# Patient Record
Sex: Female | Born: 1960 | Race: White | Hispanic: No | Marital: Married | Smoking: Former smoker
Health system: Southern US, Community
[De-identification: ages and names within clinical notes are randomized; demographics above are authoritative.]

## PROBLEM LIST (undated history)

## (undated) DIAGNOSIS — M5440 Lumbago with sciatica, unspecified side: Secondary | ICD-10-CM

## (undated) DIAGNOSIS — Z79899 Other long term (current) drug therapy: Secondary | ICD-10-CM

## (undated) DIAGNOSIS — M47817 Spondylosis without myelopathy or radiculopathy, lumbosacral region: Secondary | ICD-10-CM

## (undated) DIAGNOSIS — T4145XA Adverse effect of unspecified anesthetic, initial encounter: Secondary | ICD-10-CM

## (undated) DIAGNOSIS — N6019 Diffuse cystic mastopathy of unspecified breast: Secondary | ICD-10-CM

## (undated) DIAGNOSIS — K219 Gastro-esophageal reflux disease without esophagitis: Secondary | ICD-10-CM

## (undated) DIAGNOSIS — R300 Dysuria: Secondary | ICD-10-CM

## (undated) DIAGNOSIS — F419 Anxiety disorder, unspecified: Secondary | ICD-10-CM

## (undated) DIAGNOSIS — K900 Celiac disease: Secondary | ICD-10-CM

## (undated) DIAGNOSIS — Z9223 Personal history of estrogen therapy: Secondary | ICD-10-CM

## (undated) DIAGNOSIS — I10 Essential (primary) hypertension: Secondary | ICD-10-CM

## (undated) DIAGNOSIS — T8859XA Other complications of anesthesia, initial encounter: Secondary | ICD-10-CM

## (undated) HISTORY — DX: Celiac disease: K90.0

## (undated) HISTORY — DX: Diffuse cystic mastopathy of unspecified breast: N60.19

## (undated) HISTORY — DX: Anxiety disorder, unspecified: F41.9

## (undated) HISTORY — PX: BACK SURGERY: SHX140

## (undated) HISTORY — PX: CARPAL TUNNEL RELEASE: SHX101

## (undated) HISTORY — DX: Personal history of estrogen therapy: Z92.23

## (undated) HISTORY — DX: Dysuria: R30.0

## (undated) HISTORY — DX: Lumbago with sciatica, unspecified side: M54.40

## (undated) HISTORY — DX: Other long term (current) drug therapy: Z79.899

## (undated) HISTORY — DX: Spondylosis without myelopathy or radiculopathy, lumbosacral region: M47.817

## (undated) HISTORY — DX: Essential (primary) hypertension: I10

---

## 2001-01-27 ENCOUNTER — Other Ambulatory Visit: Admission: RE | Admit: 2001-01-27 | Discharge: 2001-01-27 | Payer: Self-pay | Admitting: Obstetrics and Gynecology

## 2001-05-14 ENCOUNTER — Ambulatory Visit (HOSPITAL_COMMUNITY): Admission: RE | Admit: 2001-05-14 | Discharge: 2001-05-14 | Payer: Self-pay | Admitting: Specialist

## 2001-05-14 ENCOUNTER — Encounter: Payer: Self-pay | Admitting: Specialist

## 2001-12-08 ENCOUNTER — Encounter: Payer: Self-pay | Admitting: Specialist

## 2001-12-08 ENCOUNTER — Ambulatory Visit (HOSPITAL_COMMUNITY): Admission: RE | Admit: 2001-12-08 | Discharge: 2001-12-08 | Payer: Self-pay | Admitting: Specialist

## 2002-12-26 ENCOUNTER — Emergency Department (HOSPITAL_COMMUNITY): Admission: EM | Admit: 2002-12-26 | Discharge: 2002-12-26 | Payer: Self-pay | Admitting: Emergency Medicine

## 2002-12-26 ENCOUNTER — Encounter: Payer: Self-pay | Admitting: Internal Medicine

## 2003-02-17 ENCOUNTER — Ambulatory Visit (HOSPITAL_COMMUNITY): Admission: RE | Admit: 2003-02-17 | Discharge: 2003-02-17 | Payer: Self-pay | Admitting: Obstetrics and Gynecology

## 2003-02-17 ENCOUNTER — Encounter: Payer: Self-pay | Admitting: Obstetrics and Gynecology

## 2004-04-04 ENCOUNTER — Ambulatory Visit (HOSPITAL_COMMUNITY): Admission: RE | Admit: 2004-04-04 | Discharge: 2004-04-04 | Payer: Self-pay | Admitting: Family Medicine

## 2004-04-19 ENCOUNTER — Ambulatory Visit (HOSPITAL_COMMUNITY): Admission: RE | Admit: 2004-04-19 | Discharge: 2004-04-19 | Payer: Self-pay | Admitting: Specialist

## 2004-04-24 ENCOUNTER — Encounter: Admission: RE | Admit: 2004-04-24 | Discharge: 2004-04-24 | Payer: Self-pay | Admitting: Specialist

## 2005-05-07 ENCOUNTER — Ambulatory Visit (HOSPITAL_COMMUNITY): Admission: RE | Admit: 2005-05-07 | Discharge: 2005-05-07 | Payer: Self-pay | Admitting: Specialist

## 2005-06-05 ENCOUNTER — Ambulatory Visit (HOSPITAL_COMMUNITY): Admission: RE | Admit: 2005-06-05 | Discharge: 2005-06-05 | Payer: Self-pay | Admitting: Specialist

## 2005-07-01 HISTORY — PX: TOTAL ABDOMINAL HYSTERECTOMY: SHX209

## 2005-07-02 ENCOUNTER — Encounter: Payer: Self-pay | Admitting: Obstetrics and Gynecology

## 2005-07-02 ENCOUNTER — Inpatient Hospital Stay (HOSPITAL_COMMUNITY): Admission: RE | Admit: 2005-07-02 | Discharge: 2005-07-04 | Payer: Self-pay | Admitting: Obstetrics and Gynecology

## 2005-10-01 HISTORY — PX: APPENDECTOMY: SHX54

## 2005-10-01 HISTORY — PX: BILATERAL SALPINGOOPHORECTOMY: SHX1223

## 2006-01-17 ENCOUNTER — Inpatient Hospital Stay (HOSPITAL_COMMUNITY): Admission: RE | Admit: 2006-01-17 | Discharge: 2006-01-19 | Payer: Self-pay | Admitting: Obstetrics and Gynecology

## 2006-01-17 ENCOUNTER — Encounter (INDEPENDENT_AMBULATORY_CARE_PROVIDER_SITE_OTHER): Payer: Self-pay | Admitting: *Deleted

## 2006-09-16 ENCOUNTER — Ambulatory Visit: Payer: Self-pay | Admitting: Orthopedic Surgery

## 2006-10-04 ENCOUNTER — Ambulatory Visit (HOSPITAL_COMMUNITY): Admission: RE | Admit: 2006-10-04 | Discharge: 2006-10-04 | Payer: Self-pay | Admitting: Orthopedic Surgery

## 2006-10-04 ENCOUNTER — Ambulatory Visit: Payer: Self-pay | Admitting: Orthopedic Surgery

## 2006-10-07 ENCOUNTER — Ambulatory Visit: Payer: Self-pay | Admitting: Orthopedic Surgery

## 2006-10-15 ENCOUNTER — Ambulatory Visit: Payer: Self-pay | Admitting: Orthopedic Surgery

## 2006-10-30 ENCOUNTER — Ambulatory Visit: Payer: Self-pay | Admitting: Orthopedic Surgery

## 2007-01-01 ENCOUNTER — Ambulatory Visit: Payer: Self-pay | Admitting: Orthopedic Surgery

## 2007-01-29 ENCOUNTER — Ambulatory Visit: Payer: Self-pay | Admitting: Orthopedic Surgery

## 2007-03-13 ENCOUNTER — Ambulatory Visit: Payer: Self-pay | Admitting: Orthopedic Surgery

## 2007-04-03 ENCOUNTER — Ambulatory Visit: Payer: Self-pay | Admitting: Orthopedic Surgery

## 2007-04-28 ENCOUNTER — Ambulatory Visit: Payer: Self-pay | Admitting: Orthopedic Surgery

## 2007-05-26 ENCOUNTER — Ambulatory Visit: Payer: Self-pay | Admitting: Orthopedic Surgery

## 2007-05-28 ENCOUNTER — Encounter: Payer: Self-pay | Admitting: Orthopedic Surgery

## 2007-07-25 DIAGNOSIS — Z8679 Personal history of other diseases of the circulatory system: Secondary | ICD-10-CM | POA: Insufficient documentation

## 2007-07-28 ENCOUNTER — Ambulatory Visit: Payer: Self-pay | Admitting: Orthopedic Surgery

## 2007-07-28 DIAGNOSIS — M25539 Pain in unspecified wrist: Secondary | ICD-10-CM | POA: Insufficient documentation

## 2007-08-15 ENCOUNTER — Ambulatory Visit: Payer: Self-pay | Admitting: Orthopedic Surgery

## 2007-08-15 ENCOUNTER — Ambulatory Visit (HOSPITAL_COMMUNITY): Admission: RE | Admit: 2007-08-15 | Discharge: 2007-08-15 | Payer: Self-pay | Admitting: Orthopedic Surgery

## 2007-08-15 ENCOUNTER — Encounter: Payer: Self-pay | Admitting: Orthopedic Surgery

## 2007-08-25 ENCOUNTER — Ambulatory Visit: Payer: Self-pay | Admitting: Orthopedic Surgery

## 2007-09-17 ENCOUNTER — Ambulatory Visit: Payer: Self-pay | Admitting: Orthopedic Surgery

## 2008-08-23 ENCOUNTER — Ambulatory Visit (HOSPITAL_COMMUNITY): Admission: RE | Admit: 2008-08-23 | Discharge: 2008-08-23 | Payer: Self-pay | Admitting: Obstetrics and Gynecology

## 2009-11-04 ENCOUNTER — Encounter: Payer: Self-pay | Admitting: Orthopedic Surgery

## 2010-10-31 NOTE — Letter (Signed)
Summary: Medical record request + authorization  Medical record request + authorization   Imported By: Ihor Austin 01/30/2010 18:13:05  _____________________________________________________________________  External Attachment:    Type:   Image     Comment:   External Document

## 2010-12-31 HISTORY — PX: NECK SURGERY: SHX720

## 2011-01-03 ENCOUNTER — Other Ambulatory Visit (HOSPITAL_COMMUNITY): Payer: Self-pay | Admitting: Internal Medicine

## 2011-01-03 DIAGNOSIS — M5412 Radiculopathy, cervical region: Secondary | ICD-10-CM

## 2011-01-03 DIAGNOSIS — R52 Pain, unspecified: Secondary | ICD-10-CM

## 2011-01-05 ENCOUNTER — Ambulatory Visit (HOSPITAL_COMMUNITY)
Admission: RE | Admit: 2011-01-05 | Discharge: 2011-01-05 | Disposition: A | Discharge status: Home / Self Care | Payer: BC Managed Care – PPO | Source: Ambulatory Visit | Attending: Internal Medicine | Admitting: Internal Medicine

## 2011-01-05 DIAGNOSIS — M4802 Spinal stenosis, cervical region: Secondary | ICD-10-CM | POA: Insufficient documentation

## 2011-01-05 DIAGNOSIS — M542 Cervicalgia: Secondary | ICD-10-CM | POA: Insufficient documentation

## 2011-01-05 DIAGNOSIS — M5020 Other cervical disc displacement, unspecified cervical region: Secondary | ICD-10-CM | POA: Insufficient documentation

## 2011-01-05 DIAGNOSIS — M25519 Pain in unspecified shoulder: Secondary | ICD-10-CM | POA: Insufficient documentation

## 2011-01-05 DIAGNOSIS — R52 Pain, unspecified: Secondary | ICD-10-CM

## 2011-01-05 DIAGNOSIS — M5412 Radiculopathy, cervical region: Secondary | ICD-10-CM

## 2011-02-13 NOTE — H&P (Signed)
[name], Walsh              ACCOUNT NO.:  [id number]   MEDICAL RECORD NO.:  [id number]          PATIENT TYPE:  AMB   LOCATION:  DAY                           FACILITY:  [facility]   PHYSICIAN:  Carole Civil, M.D.DATE OF BIRTH:  Feb 24, 1961   DATE OF ADMISSION:  DATE OF DISCHARGE:  LH                              HISTORY & PHYSICAL   CHIEF COMPLAINT:  Pain over the incision status post right carpal tunnel  release.   HISTORY:  A 50 year old female initially referred to me by Dr. Orson Ape  of [facility]. She complained of 5 months of carpal  tunnel like symptoms, had carpal tunnel release in January 2008, did  well except for incisional pain which has persisted despite injections,  rest, anti-inflammatories and steroids. She has also taken some pain  medication. She also tried desensitization techniques. She continues to  have pain and we have decided to explore the sensory branch of the  median nerve to see if this is the cause of her problem.   REVIEW OF SYSTEMS:  Negative.   ALLERGIES:  None.   History of hypertension, status post hysterectomy, takes estrogen and  lisinopril.   FAMILY HISTORY:  Negative.  She is a married housewife, does not smoke,  drink or use caffeinated beverages.  Highest grade completed was 12.   PHYSICAL EXAMINATION:  Weight 125, pulse 70, respiratory rate 18.  HEENT:  Normal.  NECK:  Supple.  There is no lymphadenopathy.  RESPIRATORY:  Clear lungs.  CARDIOVASCULAR:  Normal pulse and perfusion.  ABDOMEN:  Soft, nontender.  No distention.  RIGHT UPPER EXTREMITY:  Shows a well-healed scar which is tender on the  thumb side or thenar side.  Percussion reveals some discomfort. Carpal  tunnel tests were negative. Full range of motion noted wrist and hand.   IMPRESSION:  Persistent pain right wrist status post carpal tunnel  release. Plan for exploration of the an incision and exploration of the  sensory branch of the median  nerve.      Carole Civil, M.D.  Electronically Signed     SEH/MEDQ  D:  08/14/2007  T:  08/15/2007  Job:  [id number]   cc:   [facility]

## 2011-02-13 NOTE — Op Note (Signed)
NAMESYDNE, KRAHL              ACCOUNT NO.:  [id number]   MEDICAL RECORD NO.:  [id number]          PATIENT TYPE:  AMB   LOCATION:  DAY                           FACILITY:  [facility]   PHYSICIAN:  Carole Civil, M.D.DATE OF BIRTH:  1961/01/25   DATE OF PROCEDURE:  08/15/2007  DATE OF DISCHARGE:                               OPERATIVE REPORT   HISTORY:  This is a 50 year old female initially referred to me by Dr.  Orson Ape of [facility].  She had a carpal tunnel release  January of 2008.  All carpal tunnel symptoms were relieved, but she  still continued to have incisional pain and pain just radial to the  incision over the FCR and trapezium.  We treated her with  desensitization, anti-inflammatories, rest, bracing, steroids,  injections.  She did not improve.  She presented for exploration of the  right wrist with the presumptive diagnosis of median nerve sensory  branch entrapment.   PREOPERATIVE DIAGNOSIS:  Pain, right wrist.   POSTOPERATIVE DIAGNOSIS:  Spur, right wrist.   PROCEDURE:  Exploration of right wrist with removal of spur from  trapezium.   SURGEON:  Carole Civil, M.D., no assistants.   ANESTHETIC:  Regional Bier block.   No specimens.  No blood loss.  A 39-minute tourniquet time.  Counts were  correct at end of procedure and no complications.  The patient went to  PACU in good condition.   The patient was properly identified in the preoperative holding area as  [name].  Right wrist was properly marked for surgery.  History  and physical update was completed.  The patient was taken to surgery,  given Ancef and started with a Bier block.  After successful Bier block,  the right arm was prepped and draped using sterile technique.   The previous incision was marked, and then this incision was carried  further proximally with the marker.   We completed the time-out procedure, made the incision, carried it up  proximally across  the FCR tendon, divided the subcutaneous tissue and  looked for the nerve.  There was no nerve entrapment.  We explored the  flexor carpi radialis tendon, opened up the fibrous sheath.  There was  no inflammation.  We followed the tendon into the fiber osseous tunnel  involving the trapezium and at that point encountered a bony projection  from the trapezium.  The skin was laid over the bony projection, and  palpation revealed that this was the area of prominence and pain where  the patient was complaining.  The spur was removed.  Bone wax was used  to cover it.  Tissue was brought over the area and palpation performed  and indicated that the spur was removed, and this area was now smooth.   Wound was irrigated.  The flexor carpi radialis sheath was closed.  The  wound was further closed with 2-0 Monocryl and 3-0 nylon.  We injected  10 mL of plain Marcaine.  Sterile bandage was applied.  The patient was  taken to recovery in stable condition.   POSTOPERATIVE PLAN:  Follow up for wound checks.  Sutures to stay in 13  days if possible.      Carole Civil, M.D.  Electronically Signed     SEH/MEDQ  D:  08/15/2007  T:  08/16/2007  Job:  [id number]

## 2011-02-16 NOTE — H&P (Signed)
[name]              ACCOUNT NO.:  [id number]   MEDICAL RECORD NO.:  [id number]          PATIENT TYPE:  AMB   LOCATION:  DAY                           FACILITY:  [facility]   PHYSICIAN:  Carole Civil, M.D.DATE OF BIRTH:  Sep 23, 1961   DATE OF ADMISSION:  DATE OF DISCHARGE:  LH                              HISTORY & PHYSICAL   CHIEF COMPLAINT:  Right hand stays numb.   HISTORY:  A 50 year old female referred to me by Dr. Damien Fusi of [facility].  She complained of 5 months history of right hand  going and staying numb.  Initial treatment was with a brace but it did  not relieve her symptoms.  She had severe enough symptoms to warrant  immediate carpal tunnel release at her convenience.  She understands the  risk and benefits of the procedure, informed consent process completed,  alternative treatments discussed and declined by the patient.   REVIEW OF SYSTEMS:  Negative all 10 systems.   ALLERGIES:  She has no allergies.   PAST MEDICAL HISTORY:  She does have hypertension.   PAST SURGICAL HISTORY:  Previous surgery includes a hysterectomy.   MEDICINES:  1. Lisinopril.  2. Estrogen preparation.   FAMILY HISTORY:  Negative.   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  Highest grade completed was grade 12.   PHYSICAL EXAMINATION:  VITAL SIGNS:  Weight is 125, pulse 70,  respiratory rate is 16.  HEENT:  Normal.  NECK: Supple.  No lymphadenopathy.  RESPIRATORY EXAM:  Clear.  CARDIOVASCULAR: Cardiac exams were normal.  ABDOMEN: Nontender.  No distension.  EXTREMITIES:  Right upper extremity significant for positive carpal  tunnel compression test, positive Phalen's flexion test, decreased  sensation in the median nerve distribution.  Grip strength appears to be  remain intact.   IMPRESSION:  Right carpal tunnel syndrome, right carpal tunnel release  as planned.      Carole Civil, M.D.  Electronically Signed     SEH/MEDQ  D:  10/03/2006  T:  10/03/2006  Job:  [id number]   cc:   Leonides Grills, M.D.  Fax: [phone]

## 2011-02-16 NOTE — Op Note (Signed)
[name], [name]              ACCOUNT NO.:  [id number]   MEDICAL RECORD NO.:  [id number]          PATIENT TYPE:  AMB   LOCATION:  DAY                           FACILITY:  [facility]   PHYSICIAN:  Carole Civil, M.D.DATE OF BIRTH:  Mar 23, 1961   DATE OF PROCEDURE:  10/04/2006  DATE OF DISCHARGE:                               OPERATIVE REPORT   PREOPERATIVE DIAGNOSIS:  Carpal tunnel syndrome, right upper extremity.   POSTOPERATIVE DIAGNOSIS:  Carpal tunnel syndrome, right upper extremity.   PROCEDURE:  Open right carpal tunnel release.   SURGEON:  Carole Civil, M.D.   ANESTHETIC:  Regional Bier block.   OPERATIVE FINDINGS:  Tight stenosed carpal tunnel, no intra carpal  tunnel lesions, mild synovitis of the wrist, slight discoloration of the  nerve.   The patient's primary indication for surgery was pain and paresthesias  in the median nerve distribution with history and physical findings  consistent with carpal tunnel syndrome.  The patient failed conservative  treatment.   Informed consent process was completed, the patient consented to this  surgery on the right wrist for carpal tunnel release.   This patient identified in preop holding area as [name].  Her  right wrist was marked for surgery, countersigned by me the surgeon.  I  updated her history and physical.  She was given antibiotics 1 gram IV  Ancef and taken to the operating room for Bier block.  After successful  Bier block, time-out procedure was completed.  The wrist was prepped and  draped using sterile technique.   The carpal tunnel was opened by incising the skin in line with the  radial border of the ring finger, dividing the subcu tissue and palmar  fascia until the distal aspect of the carpal tunnel was identified with  blunt dissection.  Blunt dissection was further carried out beneath the  ligament and then the ligament was released.  Carpal tunnel contents  were inspected.  The nerve  was discolored pinched at the transverse  carpal ligament.  No intra tunnel lesions were identified and synovitis  was noted.   The wound was irrigated and closed with 3-0 nylon suture in interrupted  fashion.  On the radial side of the wound we injected 10 mL of plain  Sensorcaine.  The patient will had a sterile dressing applied and was  taken to the recovery area in stable condition.  Follow-up on Monday.  Discharged on Lorcet Plus one every four hours as needed for pain #60  with two refills.  Instructions given at time of discharge.      Carole Civil, M.D.  Electronically Signed     SEH/MEDQ  D:  10/04/2006  T:  10/04/2006  Job:  [id number]

## 2011-02-16 NOTE — Op Note (Signed)
[name], [name]              ACCOUNT NO.:  [id number]   MEDICAL RECORD NO.:  [id number]          PATIENT TYPE:  INP   LOCATION:  H885                          FACILITY:  [facility]   PHYSICIAN:  Jonnie Kind, M.D. DATE OF BIRTH:  1961-01-30   DATE OF PROCEDURE:  07/02/2005  DATE OF DISCHARGE:                                 OPERATIVE REPORT   PREOPERATIVE DIAGNOSIS:  Menorrhagia, degenerating uterine fibroids.   POSTOPERATIVE DIAGNOSIS:  Menorrhagia, degenerating uterine fibroids.   OPERATION PERFORMED:  Total abdominal hysterectomy.   SURGEON:  Jonnie Kind, M.D.   ASSISTANTLoreli Slot, RN.   ANESTHESIA:  General.   COMPLICATIONS:  None.   ESTIMATED BLOOD LOSS:  250 mL.   FINDINGS:  Diffuse oozing tendencies throughout the case both at the fascia  level.   DESCRIPTION OF PROCEDURE:  The patient was taken to the operating room,  prepped and draped for lower abdominal surgery.  A Pfannenstiel incision was  performed with easy entry in the peritoneal cavity without evidence of  bleeding, trauma or adhesions.  Bowel was elevated.  The sigmoid was  attached to the infundibulopelvic ligament, just superior and the round  ligament on the left.  These were taken down.  There was generous oozing  from this area.  Point cautery was used being careful to stay away from the  lateral iliac vessels.   The evidence of the prior tubal sterilization on each side showed firm  fibrosis of the proximal tube on either side. The round ligaments  were  doubly ligated and transected to each side, then the peritoneum opened over  the bladder flap.  There was a couple of blood vessels in this area that  required point cautery.  The ovaries were inspected.  The right ovary had a  hemorrhagic corpus luteum cyst in it and the left tube and ovary were  grossly normal with evidence of prior tubal sterilization.  Preservation was  considered appropriate and so the ovaries were left in place.  It  should be  noted that there was a generous amount of peritoneal fluid, felt associated  with the prior bowel prep.   The utero-ovarian ligament was clamped, cut and suture ligated bilaterally  using Kelly clamps times two and 0 chromic suture ligature.  We then cross-  clamped the uterine vessels on either side with curved Heaney clamps and  with Kelly clamps for back bleeding.  Transected the vessels and ligated  with 0 chromic.  The upper cardinal ligaments were then serially clamped,  cut and suture ligated bilaterally using 0 chromic, Mayo scissors and  straight Heaney clamps.  We marched down to the level of the cervix on each  side, serially clamping, cutting and suture ligating. At this time an  anterior stab incision could be made in the cervicovaginal fornix  anteriorly, the cervix amputated off the vaginal cuff and the vaginal cuff  attached to the lateral lower cardinal ligaments with an Aldridge stitch of  0 chromic.  This was tagged for hemostasis.  The middle portion of the cuff  was reapproximated with  interrupted 2-0 chromic sutures.  Hemostasis was  stilled challenged with small oozing along the cuff that responded to point  cautery.  The pelvis was inspected, copiously irrigated, reinspected for  hemostasis and found satisfactorily hemostatic.  Cuff was oversewn using  running 2-0 chromic.   The pelvis was irrigated.  Laparotomy equipment removed.  Hemostasis  confirmed, no evidence of bowel injury or trauma identified.  The anterior  peritoneum was closed with 2-0 chromic. The rectus muscle on the left had  separated somewhat at its attachment to the symphysis pubis and so we  reapproximated the fibrosis at the end of the rectus muscles to its  insertion site with interrupted 2-0 chromic times three stitches.  0 Vicryl  closure of the fascia followed.  Subcutaneous tissues were reapproximated  using running 2-0 chromic and staple closure of the skin completed the   procedure.  The patient tolerated the procedure well and went to recovery  room in good condition.      Jonnie Kind, M.D.  Electronically Signed     JVF/MEDQ  D:  07/02/2005  T:  07/02/2005  Job:  [id number]

## 2011-02-16 NOTE — H&P (Signed)
[name], [name]              ACCOUNT NO.:  [id number]   MEDICAL RECORD NO.:  [id number]          PATIENT TYPE:  AMB   LOCATION:  DAY                           FACILITY:  [facility]   PHYSICIAN:  Jonnie Kind, M.D. DATE OF BIRTH:  16-May-1961   DATE OF ADMISSION:  DATE OF DISCHARGE:  LH                                HISTORY & PHYSICAL   DIAGNOSES:  1.  Menometrorrhagia.  2.  Degenerating uterine fibroid.   HISTORY OF PRESENT ILLNESS:  This 50 year old female is seen in our office  for GYN care. She recently had a normal Pap smear but complained of pelvic  discomfort. The evaluation included an ultrasound which shows the uterus to  be upper limits normal size with a suspected degenerating fibroid in the  anterior uterus. The pain is primarily during her menses. She complains of  heavy menstrual flow, lasting greater than a week. Ovarian preservation was  discussed at length, and she wanted to the procedure to include preservation  of the ovary unless visible  abnormalities were noted. Ultrasound described  a 8.6 x 4.4 x 5.8 cm uterus with degenerating small fibroid, normal ovaries  bilaterally, simple right ovarian cyst 2 cm in size. The patient originally  decided to proceed with surgery while under Dr. Maudry Diego evaluation and  confirmed her desire upon evaluation in my office to proceed toward  hysterectomy.   PAST MEDICAL HISTORY:  Positive for hypertension.   PAST SURGICAL HISTORY:  Tubal ligation.   FAMILY HISTORY:  Negative.   PHYSICAL EXAMINATION:  VITAL SIGNS:  Weight 132, blood pressure 112/60.  GENERAL:  Shows a cheerful, healthy, Caucasian female, alert and oriented  x3.  HEENT:  Pupils are equal, round, and reactive. Extraocular movements intact.  NECK:  Supple. Trachea midline.  CHEST:  Clear to auscultation.  ABDOMEN:  Nontender.  GENITOURINARY:  Multiparous. Pap smear Class I. Nonpurulent secretions. GC  and chlamydia cultures are negative. Uterus upper  limits of normal size,  tender to contact. Adnexa nontender without masses or immobility.   PLAN:  TAH, probable preservation of ovaries (BSO authorized if abnormal).  Surgery scheduled for July 02, 2005.      Jonnie Kind, M.D.  Electronically Signed     JVF/MEDQ  D:  06/29/2005  T:  06/29/2005  Job:  [id number]

## 2011-02-16 NOTE — H&P (Signed)
[name], [name]              ACCOUNT NO.:  [id number]   MEDICAL RECORD NO.:  [id number]          PATIENT TYPE:  AMB   LOCATION:  DAY                           FACILITY:  [facility]   PHYSICIAN:  Jonnie Kind, M.D. DATE OF BIRTH:  [birth year]   DATE OF ADMISSION:  DATE OF DISCHARGE:  LH                                HISTORY & PHYSICAL   ADMISSION DIAGNOSES:  1.  Pelvic pain secondary to entrapped ovaries, right ovarian cyst.  2.  Abdominal wall at old Pfannenstiel incision sites.   HISTORY OF PRESENT ILLNESS:  This 50 year old female only six months status  post hysterectomy for menorrhagia with degenerating uterine fibroids, who  experienced diffuse oozing throughout the case at both the fascial level and  the pelvis, who is admitted at this time for bilateral salpingo-oophorectomy  and revision of the incision.  The patient's hysterectomy was complicated by  greater than expected blood loss.  She had diffuse oozing throughout the  case.  Her hemoglobin preoperatively was 11.9, hematocrit 34.9.  Post  surgically, it was 9.9, hematocrit 29.8, on postop day #1.  She was  discharged on the second postoperative day.   The incision showed erythema and induration during the postoperative care.  The cuff was slightly stiff.  She received antibiotics and was treated for  cuff induration.  She remained afebrile.  Nonetheless, she has had a poor  surgical result with tenderness and trapping of the ovaries against the  cuff.  CT of the abdomen shows a right ovarian cyst, which is subjectively  tender.  We suppressed the ovaries without results; therefore, we are  proceeded with bilateral oophorectomy.  Patient has no interest in  attempting to preserve an ovary.   PAST MEDICAL HISTORY:  Essentially benign.  Cigarettes:  None.  Alcohol:  None.  Recreational drugs:  Denied.   PAST SURGICAL HISTORY:  Tubal ligation in the distant past.  Hysterectomy  recently.   MEDICATIONS:   Hypertension.  Managed by Dr. Orson Ape with meds received a  [facility].   PHYSICAL EXAMINATION:  GENERAL:  She is a healthy, somewhat somber,  uncomfortable Caucasian female.  Alert and oriented x3.  HEENT:  Pupils are equal, round and reactive.  Extraocular movements are  intact.  NECK:  Supple.  CHEST:  Clear to auscultation.  ABDOMEN:  Sensitive abdominal scar, tender and somewhat retracted.  Increased discomfort with movement.  Sensitive to touch or clothing.  Tender  lower abdomen.  EXTERNAL GENITALIA:  Normal with sensitive cuff.  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   PLAN:  Laparotomy, revision of abdominal incision to remove old scar and  fibrosis, then remove both tubes and ovaries.  Potential for subsequent  dyspareunia is known to persist, and patient is aware of this.  Will proceed  on January 17, 2006.      Jonnie Kind, M.D.  Electronically Signed     JVF/MEDQ  D:  01/16/2006  T:  01/16/2006  Job:  [id number]   cc:   Leonides Grills, M.D.  Fax: [phone]

## 2011-02-16 NOTE — Op Note (Signed)
[name], [name]              ACCOUNT NO.:  [id number]   MEDICAL RECORD NO.:  [id number]          PATIENT TYPE:  AMB   LOCATION:  DAY                           FACILITY:  [facility]   PHYSICIAN:  Jonnie Kind, M.D. DATE OF BIRTH:  10-08-1960   DATE OF PROCEDURE:  DATE OF DISCHARGE:                                 OPERATIVE REPORT   PREOPERATIVE DIAGNOSES:  1.  Pelvic pain, entrapped ovaries, right ovarian cyst.  2.  Abdominal wall pain at Pfannenstiel incision site.   POSTOPERATIVE DIAGNOSIS:  1.  Pelvic pain, entrapped ovaries, right ovarian cyst.  2.  Abdominal wall pain at Pfannenstiel incision site.  3.  Extensive pelvic adhesions.   PROCEDURE:  Bilateral salpingo-oophorectomy, appendectomy, lysis of  adhesions.   SURGEON:  Jonnie Kind, M.D.   ASSISTANTVernia Buff, RN.   ANESTHESIA:  Rock Nephew, CRNA.   COMPLICATIONS:  None.   FINDINGS:  1.  Small bowel adhesion to right round ligament stump.  2.  Extensive adhesions of sigmoid colon overlapping and completely covering      left adnexal structures, requiring careful dissection to expose and      remove the left tube and ovary.  3.  Small appendix, removed prophylactically.   DETAILS OF PROCEDURE:  Patient was taken to the operating room, prepped and  draped for lower abdominal surgery.  The prior Pfannenstiel incision, which  appeared good and when well healed, was taken out with mobilization with  excision of the scar tissue down to the fascia, which was opened in a  standard Pfannenstiel technique.  This was mobilized sufficiently to a low  midline peritoneal cavity entry.  There were no adhesions to the anterior  abdominal wall.  The bowel could be elevated.  There were some small bowel  adhesions on the right side to the round ligament pedicle.  These required  careful resection.  There was no suspicion of bowel injury during this  resection.  The small bowel could be elevated.  The right adnexa  was  identifiable, and there was a band of firm adhesions from the back side of  the right ovary to the right sidewall.  These could be resected, and the  right adnexa mobilized.  There was a paratubal cyst on the right about 3 cm  in size.  The right adnexa was isolated.  The retroperitoneum opened, in  order to identify the ureter and to confirm safety of the ureter.  Then the  infundibulopelvic ligament on the right could be isolated, clamped, cut, and  suture ligated with 2-0 chromic.  The right adnexa was hemostatic.  Attention was then directed to the left side, where the dissection was  technically much more challenging.  Careful dissection using the Metzenbaum  scissors, small pickups, and Kitner dissector were used to carefully peel  the bowel and epiploic fat attachments from the side wall.  With care, we  were able to free up the side wall.  The left tube and ovary was essentially  matted against the side wall but could be grasped, placed on traction and  counter-traction and gradually peeled from the side wall without any  suspicion for retroperitoneal resection.  The ureter could be identified by  peristalsis well out of the area of surgical resection.  Once the tube and  ovary on the left were sufficiently mobile, there was easy access to the  infundibulopelvic ligament, a 2-0 chromic ligature was performed around the  pedicle, and then this specimen removed.  There were some rectosigmoid  adhesions to the area where the hysterectomy had been performed, the cuff  remnants, and this was resected, mobilizing the bowel completely.  There was  a small bit of point cautery necessary over the vaginal cuff apex to achieve  adequacy of hemostasis.  Bowel surfaces were carefully inspected, and there  was no suspicion of bowel injury at any point.   A laparotomy tape was left in the cul de sac temporarily while we performed  the appendectomy.   Appendectomy was performed, then by  grasping the appendix with Babcock  clamp, elevating it, isolating the mesoappendix, cross-clamping both the  appendiceal stump and the mesoappendix with hemostats, resecting the  appendix, then ligating each tube pedicle with 2-0 chromic.  Good suture  stability was confirmed on the appendiceal stump.   The pelvis was irrigated copiously.  Laparotomy tapes were removed, counted,  and found to be correct, and the anterior peritoneum closed with 2-0  chromic, leaving a generous amount of saline in the pelvis.  The fascia was  closed in a standard 2-0 Vicryl running fashion, then the subcutaneous  tissue was mobilized, pulled into approximation, and then with running and  interrupted 2-0 plain, then staple closure to the skin completed the  procedure.  The patient's skin tone was quite taut, but there was no  difficulty achieving approximation of skin edges.  Patient went to recovery  in stable condition.  EBL 100 cc.      Jonnie Kind, M.D.  Electronically Signed     JVF/MEDQ  D:  01/17/2006  T:  01/17/2006  Job:  [id number]   cc:   Leonides Grills, M.D.  Fax: [phone]

## 2011-02-16 NOTE — Discharge Summary (Signed)
[name], [name]              ACCOUNT NO.:  [id number]   MEDICAL RECORD NO.:  [id number]          PATIENT TYPE:  INP   LOCATION:  A419                          FACILITY:  [facility]   PHYSICIAN:  Jonnie Kind, M.D. DATE OF BIRTH:  Nov 23, 1960   DATE OF ADMISSION:  01/17/2006  DATE OF DISCHARGE:  04/21/2007LH                                 DISCHARGE SUMMARY   ADMITTING DIAGNOSES:  1.  Abdominal pain secondary to trapped ovaries, right ovarian cyst,      abdominal wall fibrosis, Pfannenstiel incision site.   DISCHARGE DIAGNOSES:  1.  Abdominal pain secondary to trapped ovaries, right ovarian cyst,      abdominal wall fibrosis, Pfannenstiel incision site.  2.  Dyspareunia.  3.  Pelvic peritoneal adhesions, 614.6.  4.  Non-inflammatory disease of the adnexa, 620.8.   HOSPITAL SUMMARY:  This 50 year old female six months status post  hysterectomy for degenerating uterine fibroids who experienced diffuse  oozing throughout the case was admitted for removal of tubes and ovaries and  revision of a painful lower abdominal scar.  It was felt that she had had  postoperative oozing that resulted in post surgical fibrosis.   PAST MEDICAL HISTORY:  1.  Positive for hypertension managed by [facility], Dr.      Orson Ape.  2.  Surgical hysterectomy.   PAST SURGICAL HISTORY:  1.  Hysterectomy.  2.  Prior tubal ligation.  3.  Appendectomy.  4.  Lysis of adhesions.   HOSPITAL COURSE:  Patient underwent laparotomy, excision of the old keloid  with revision of abdominal wall sufficiently to have improved post surgical  appearance and sensation.  She also had appendectomy and lysis of adhesions.  Postoperatively the patient did well, tolerated a regular diet, felt better  subjectively.  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  It also showed paratubal cysts on the left and surface  adhesions to the right ovary and a benign appendix.   Postoperative hemoglobin was 9.3, hematocrit 28.2 compared to 10.4, 31.1 on  admission.  She was discharged in two days for follow-up for routine post  surgical evaluation in one week with discharge medications as follows.   DISCHARGE MEDICATIONS:  1.  Tylox one to two caps q.4h. p.r.n. pain.  2.  Multivitamin daily.  3.  Prevacid OTC daily.  4.  __________ 10 mg daily for hypertension.      Jonnie Kind, M.D.  Electronically Signed     JVF/MEDQ  D:  03/08/2006  T:  03/08/2006  Job:  [id number]   cc:   Leonides Grills, M.D.  Fax: [phone]

## 2011-02-16 NOTE — Discharge Summary (Signed)
NAMEMORNA, FLUD              ACCOUNT NO.:  [id number]   MEDICAL RECORD NO.:  [id number]          PATIENT TYPE:  INP   LOCATION:  Z735                          FACILITY:  [facility]   PHYSICIAN:  Jonnie Kind, M.D. DATE OF BIRTH:  02-13-1961   DATE OF ADMISSION:  07/02/2005  DATE OF DISCHARGE:  10/04/2006LH                                 DISCHARGE SUMMARY   ADMISSION DIAGNOSES:  1.  Menometrorrhagia.  2.  Degenerating uterine fibroid.   DISCHARGE DIAGNOSES:  1.  Menometrorrhagia.  2.  Degenerating uterine fibroid.   PROCEDURES:  Total abdominal hysterectomy performed on July 02, 2005.   DISCHARGE MEDICATIONS:  1.  Vicodin 5/500 1-2 q.4 h p.r.n. pain.  2.  Motrin 200 mg 2 q.4 h p.r.n. pain.   DISCHARGE INSTRUCTIONS:  Routine postsurgical instructions including  followup visit in one week, discussed with patient.   HOSPITAL SUMMARY:  See HPI for admitting history.   HOSPITAL COURSE:  The patient was admitted and underwent hysterectomy with  uncomplicated surgery, 670 cc blood loss.  The patient had admitted  hemoglobin of 11.9, hematocrit 34.9, mildly anemic.  Postsurgically, the  hemoglobin was 9.9, hematocrit 29.8 on postop day #1.  Incision looked fine.  She tolerated regular diet.  Was stable for discharge on postop day #2 for  follow up in one week or follow up postop day #7 of her staple removal.      Jonnie Kind, M.D.  Electronically Signed     JVF/MEDQ  D:  07/04/2005  T:  07/04/2005  Job:  [id number]

## 2011-07-10 LAB — BASIC METABOLIC PANEL
BUN: 6
Chloride: 102
GFR calc non Af Amer: 59 — ABNORMAL LOW
Potassium: 4.8
Sodium: 140

## 2011-07-10 LAB — HEMOGLOBIN AND HEMATOCRIT, BLOOD
HCT: 35.7 — ABNORMAL LOW
Hemoglobin: 11.9 — ABNORMAL LOW

## 2011-10-11 ENCOUNTER — Other Ambulatory Visit: Payer: Self-pay | Admitting: Adult Health

## 2011-10-11 DIAGNOSIS — Z139 Encounter for screening, unspecified: Secondary | ICD-10-CM

## 2011-10-15 ENCOUNTER — Ambulatory Visit (HOSPITAL_COMMUNITY)
Admission: RE | Admit: 2011-10-15 | Discharge: 2011-10-15 | Disposition: A | Discharge status: Home / Self Care | Payer: BC Managed Care – PPO | Source: Ambulatory Visit | Attending: Adult Health | Admitting: Adult Health

## 2011-10-15 DIAGNOSIS — Z1231 Encounter for screening mammogram for malignant neoplasm of breast: Secondary | ICD-10-CM | POA: Insufficient documentation

## 2011-10-15 DIAGNOSIS — Z139 Encounter for screening, unspecified: Secondary | ICD-10-CM

## 2012-12-12 ENCOUNTER — Other Ambulatory Visit: Payer: Self-pay | Admitting: Adult Health

## 2012-12-12 DIAGNOSIS — IMO0001 Reserved for inherently not codable concepts without codable children: Secondary | ICD-10-CM

## 2012-12-15 ENCOUNTER — Ambulatory Visit (HOSPITAL_COMMUNITY)
Admission: RE | Admit: 2012-12-15 | Discharge: 2012-12-15 | Disposition: A | Discharge status: Home / Self Care | Payer: BC Managed Care – PPO | Source: Ambulatory Visit | Attending: Adult Health | Admitting: Adult Health

## 2012-12-15 DIAGNOSIS — IMO0001 Reserved for inherently not codable concepts without codable children: Secondary | ICD-10-CM

## 2012-12-15 DIAGNOSIS — Z1231 Encounter for screening mammogram for malignant neoplasm of breast: Secondary | ICD-10-CM | POA: Insufficient documentation

## 2012-12-21 ENCOUNTER — Encounter: Payer: Self-pay | Admitting: Adult Health

## 2012-12-21 DIAGNOSIS — F419 Anxiety disorder, unspecified: Secondary | ICD-10-CM | POA: Insufficient documentation

## 2012-12-21 DIAGNOSIS — Z9223 Personal history of estrogen therapy: Secondary | ICD-10-CM | POA: Insufficient documentation

## 2012-12-22 ENCOUNTER — Encounter: Payer: Self-pay | Admitting: Adult Health

## 2012-12-22 ENCOUNTER — Ambulatory Visit (INDEPENDENT_AMBULATORY_CARE_PROVIDER_SITE_OTHER): Payer: BC Managed Care – PPO | Admitting: Adult Health

## 2012-12-22 VITALS — BP 122/80 | HR 76 | Ht 62.5 in | Wt 128.0 lb

## 2012-12-22 DIAGNOSIS — Z79890 Hormone replacement therapy (postmenopausal): Secondary | ICD-10-CM

## 2012-12-22 DIAGNOSIS — I10 Essential (primary) hypertension: Secondary | ICD-10-CM

## 2012-12-22 DIAGNOSIS — F411 Generalized anxiety disorder: Secondary | ICD-10-CM

## 2012-12-22 DIAGNOSIS — Z1212 Encounter for screening for malignant neoplasm of rectum: Secondary | ICD-10-CM

## 2012-12-22 DIAGNOSIS — Z0000 Encounter for general adult medical examination without abnormal findings: Secondary | ICD-10-CM

## 2012-12-22 DIAGNOSIS — Z01419 Encounter for gynecological examination (general) (routine) without abnormal findings: Secondary | ICD-10-CM

## 2012-12-22 LAB — HEMOCCULT GUIAC POC 1CARD (OFFICE): Fecal Occult Blood, POC: NEGATIVE

## 2012-12-22 MED ORDER — BUSPIRONE HCL 5 MG PO TABS: 5.0000 mg | ORAL_TABLET | Freq: Two times a day (BID) | ORAL | Status: DC

## 2012-12-22 MED ORDER — EST ESTROGENS-METHYLTEST 1.25-2.5 MG PO TABS: 1.0000 | ORAL_TABLET | Freq: Every day | ORAL | Status: DC

## 2012-12-22 MED ORDER — LISINOPRIL 10 MG PO TABS: 10.0000 mg | ORAL_TABLET | Freq: Every day | ORAL | Status: DC

## 2012-12-22 NOTE — Progress Notes (Signed)
Subjective:     Patient ID: [name], female   DOB: 1961-05-24, 52 y.o.   MRN: [id number]  HPI [name] is a 52 year old white female, married, gravida 1 para 1, status post hysterectomy, in for physical exam and refill medications.   Review of Systems Patient denies any headaches, blurred vision, shortness of breath, chest pain, abdominal pain, problems with bowel movements, urination or intercourse. She does have pain in her right wrist it is chronic and sees Dr. Romeo Apple, she denies any rashes, and she says her moods are better, her dad recently passed away and she said her stress level is lower. Reviewed past medical, surgical, social and family histories.     Objective:   Physical Exam Skin: Warm and dry, no rashes, vital signs blood pressure 122/80, pulse 76,weight:128lbs.,height:62.5 inches, BMI: 23.Perrl. Neck: Midline trachea. Thyroid normal Lungs: Clear to auscultation bilaterally. Breasts: No dominant palpable mass, retraction, or nipple discharge. Cardiovascular: Regular rate and rhythm. Abdomen: Soft and non-tender, no hepatosplenomegaly. Pelvic: External genitalia is normal and appearance. The vagina is normal in appearance. Cervix and uterus are absent. No adnexal masses or tenderness noted.Rectal: good sphincter tone, no polyps or hemorrhoids. Her hemoccult was negative.     Assessment:    Yearly Exam Hypertension Estrogen Replacement Anxiety     Plan:    Refilled lisinopril, BuSpar, and Estratest. Mammogram advised yearly. Colonoscopy advised. Followup with Dr. Regino Schultze for shingles vaccination, return to clinic in one year for physical,prn problems.

## 2012-12-22 NOTE — Patient Instructions (Addendum)
Continue current meds, mammmogram yearly, check with Dr. Regino Schultze regarding shingles vaccine. Recommend colonoscopy. Return to clinic in 1 year for physical prn problems,

## 2013-08-09 ENCOUNTER — Other Ambulatory Visit: Payer: Self-pay | Admitting: Adult Health

## 2013-08-11 ENCOUNTER — Telehealth: Payer: Self-pay | Admitting: Adult Health

## 2013-08-11 NOTE — Telephone Encounter (Signed)
Pt aware Estratest has been refilled at [facility] in [place]. [name]

## 2013-10-19 ENCOUNTER — Telehealth: Payer: Self-pay | Admitting: Adult Health

## 2013-10-19 NOTE — Telephone Encounter (Signed)
Pt wanted to know if she needed a referral to Dr. Karilyn Cotaehman. I spoke with JAG and she advised most of the time no but if she did for the pt to call back and she would do it for her.

## 2013-11-09 ENCOUNTER — Telehealth: Payer: Self-pay | Admitting: Adult Health

## 2013-11-09 DIAGNOSIS — Z139 Encounter for screening, unspecified: Secondary | ICD-10-CM

## 2013-11-09 NOTE — Telephone Encounter (Signed)
Pt wants referral for colonoscopy to Dr Karilyn Cotaehman

## 2013-11-10 ENCOUNTER — Encounter (INDEPENDENT_AMBULATORY_CARE_PROVIDER_SITE_OTHER): Payer: Self-pay | Admitting: *Deleted

## 2013-11-19 ENCOUNTER — Telehealth (INDEPENDENT_AMBULATORY_CARE_PROVIDER_SITE_OTHER): Payer: Self-pay | Admitting: *Deleted

## 2013-11-19 ENCOUNTER — Encounter (INDEPENDENT_AMBULATORY_CARE_PROVIDER_SITE_OTHER): Payer: Self-pay | Admitting: *Deleted

## 2013-11-19 ENCOUNTER — Other Ambulatory Visit (INDEPENDENT_AMBULATORY_CARE_PROVIDER_SITE_OTHER): Payer: Self-pay | Admitting: *Deleted

## 2013-11-19 DIAGNOSIS — Z1211 Encounter for screening for malignant neoplasm of colon: Secondary | ICD-10-CM

## 2013-11-19 MED ORDER — PEG-KCL-NACL-NASULF-NA ASC-C 100 G PO SOLR: 1.0000 | Freq: Once | ORAL | Status: DC

## 2013-11-19 NOTE — Telephone Encounter (Signed)
Patient needs movi prep 

## 2013-12-09 ENCOUNTER — Other Ambulatory Visit: Payer: Self-pay | Admitting: Adult Health

## 2013-12-09 DIAGNOSIS — Z1231 Encounter for screening mammogram for malignant neoplasm of breast: Secondary | ICD-10-CM

## 2013-12-14 ENCOUNTER — Encounter: Payer: Self-pay | Admitting: Adult Health

## 2013-12-14 ENCOUNTER — Encounter (INDEPENDENT_AMBULATORY_CARE_PROVIDER_SITE_OTHER): Payer: Self-pay

## 2013-12-14 ENCOUNTER — Ambulatory Visit (INDEPENDENT_AMBULATORY_CARE_PROVIDER_SITE_OTHER): Payer: BC Managed Care – PPO | Admitting: Adult Health

## 2013-12-14 VITALS — BP 144/84 | HR 72 | Ht 62.5 in | Wt 131.0 lb

## 2013-12-14 DIAGNOSIS — Z79899 Other long term (current) drug therapy: Secondary | ICD-10-CM

## 2013-12-14 DIAGNOSIS — Z01419 Encounter for gynecological examination (general) (routine) without abnormal findings: Secondary | ICD-10-CM

## 2013-12-14 DIAGNOSIS — F419 Anxiety disorder, unspecified: Secondary | ICD-10-CM

## 2013-12-14 DIAGNOSIS — Z8679 Personal history of other diseases of the circulatory system: Secondary | ICD-10-CM

## 2013-12-14 DIAGNOSIS — Z1212 Encounter for screening for malignant neoplasm of rectum: Secondary | ICD-10-CM

## 2013-12-14 LAB — HEMOCCULT GUIAC POC 1CARD (OFFICE): FECAL OCCULT BLD: NEGATIVE

## 2013-12-14 MED ORDER — BUSPIRONE HCL 5 MG PO TABS: 5.0000 mg | ORAL_TABLET | Freq: Two times a day (BID) | ORAL | Status: DC

## 2013-12-14 MED ORDER — EST ESTROGENS-METHYLTEST 1.25-2.5 MG PO TABS: ORAL_TABLET | ORAL | Status: DC

## 2013-12-14 MED ORDER — LISINOPRIL 10 MG PO TABS: 10.0000 mg | ORAL_TABLET | Freq: Every day | ORAL | Status: DC

## 2013-12-14 NOTE — Progress Notes (Signed)
Patient ID: [name], female   DOB: 12-07-1960, 53 y.o.   MRN: [id number] History of Present Illness: [name] is a 53 year old white female, married in for a physical.Has some dryness with sex.   Current Medications, Allergies, Past Medical History, Past Surgical History, Family History and Social History were reviewed in [employer] record.     Review of Systems: Patient denies any headaches, blurred vision, shortness of breath, chest pain, abdominal pain, problems with bowel movements, urination, or intercourse. No joint pain or mood swings(takin buspar and it is working well), see positive in HPI.    Physical Exam:BP 144/84  Pulse 72  Ht 5' 2.5" (1.588 m)  Wt 131 lb (59.421 kg)  BMI 23.56 kg/m2 General:  Well developed, well nourished, no acute distress Skin:  Warm and dry Neck:  Midline trachea, normal thyroid Lungs; Clear to auscultation bilaterally Breast:  No dominant palpable mass, retraction, or nipple discharge Cardiovascular: Regular rate and rhythm Abdomen:  Soft, non tender, no hepatosplenomegaly Pelvic:  External genitalia is normal in appearance.  The vagina is normal in appearance. The cervix and uterus are absent.  No  adnexal masses or tenderness noted. Rectal: Good sphincter tone, no polyps, or hemorrhoids felt.  Hemoccult negative. Extremities:  No swelling or varicosities noted Psych:Alert and cooperative, seems happy, still baby sits.     Impression: Yearly gyn exam no pap Estrogen therapy Hypertension Anxiety      Plan: Physical in 1 year Mammogram 12/21/13 Colonoscopy 01/13/14 Refilled Buspar 5 mg 1 bid x 1 year Refilled estratest x 6 months Refilled lisinopril 10 mg 1 daily x 1 year Try luvena and astroglide Call prn problems

## 2013-12-14 NOTE — Patient Instructions (Signed)
Physical in 1 year Mammogram yearly Colonoscopy in april

## 2013-12-18 ENCOUNTER — Telehealth (INDEPENDENT_AMBULATORY_CARE_PROVIDER_SITE_OTHER): Payer: Self-pay | Admitting: *Deleted

## 2013-12-18 NOTE — Telephone Encounter (Signed)
  Procedure: tcs  Reason/Indication:  screening  Has patient had this procedure before?  no  If so, when, by whom and where?    Is there a family history of colon cancer?  no  Who?  What age when diagnosed?    Is patient diabetic?   no      Does patient have prosthetic heart valve?  no  Do you have a pacemaker?  no  Has patient ever had endocarditis? no  Has patient had joint replacement within last 12 months?  no  Does patient tend to be constipated or take laxatives? no  Is patient on Coumadin, Plavix and/or Aspirin? no  Medications: see epic  Allergies: nkda  Medication Adjustment:   Procedure date & time: 01/13/14

## 2013-12-21 ENCOUNTER — Ambulatory Visit (HOSPITAL_COMMUNITY)
Admission: RE | Admit: 2013-12-21 | Discharge: 2013-12-21 | Disposition: A | Discharge status: Home / Self Care | Payer: BC Managed Care – PPO | Source: Ambulatory Visit | Attending: Adult Health | Admitting: Adult Health

## 2013-12-21 DIAGNOSIS — Z1231 Encounter for screening mammogram for malignant neoplasm of breast: Secondary | ICD-10-CM

## 2013-12-21 NOTE — Telephone Encounter (Signed)
agree

## 2013-12-29 ENCOUNTER — Encounter (HOSPITAL_COMMUNITY): Payer: Self-pay | Admitting: Pharmacy Technician

## 2014-01-13 ENCOUNTER — Encounter (HOSPITAL_COMMUNITY): Payer: Self-pay | Admitting: *Deleted

## 2014-01-13 ENCOUNTER — Ambulatory Visit (HOSPITAL_COMMUNITY)
Admission: RE | Admit: 2014-01-13 | Discharge: 2014-01-13 | Disposition: A | Discharge status: Home / Self Care | Payer: BC Managed Care – PPO | Source: Ambulatory Visit | Attending: Internal Medicine | Admitting: Internal Medicine

## 2014-01-13 ENCOUNTER — Encounter (HOSPITAL_COMMUNITY)
Admission: RE | Disposition: A | Discharge status: Home / Self Care | Payer: Self-pay | Source: Ambulatory Visit | Attending: Internal Medicine

## 2014-01-13 DIAGNOSIS — K644 Residual hemorrhoidal skin tags: Secondary | ICD-10-CM

## 2014-01-13 DIAGNOSIS — Z1211 Encounter for screening for malignant neoplasm of colon: Secondary | ICD-10-CM | POA: Insufficient documentation

## 2014-01-13 DIAGNOSIS — F411 Generalized anxiety disorder: Secondary | ICD-10-CM | POA: Insufficient documentation

## 2014-01-13 DIAGNOSIS — Z79899 Other long term (current) drug therapy: Secondary | ICD-10-CM | POA: Insufficient documentation

## 2014-01-13 DIAGNOSIS — K648 Other hemorrhoids: Secondary | ICD-10-CM | POA: Insufficient documentation

## 2014-01-13 DIAGNOSIS — I10 Essential (primary) hypertension: Secondary | ICD-10-CM | POA: Insufficient documentation

## 2014-01-13 HISTORY — PX: COLONOSCOPY: SHX5424

## 2014-01-13 SURGERY — COLONOSCOPY
Anesthesia: Moderate Sedation

## 2014-01-13 MED ORDER — STERILE WATER FOR IRRIGATION IR SOLN
Status: DC | PRN
  Administered 2014-01-13: 10:00:00

## 2014-01-13 MED ORDER — MEPERIDINE HCL 50 MG/ML IJ SOLN
INTRAMUSCULAR | Status: AC
  Filled 2014-01-13: qty 1

## 2014-01-13 MED ORDER — MIDAZOLAM HCL 5 MG/5ML IJ SOLN
INTRAMUSCULAR | Status: AC
  Filled 2014-01-13: qty 10

## 2014-01-13 MED ORDER — SODIUM CHLORIDE 0.9 % IV SOLN
INTRAVENOUS | Status: DC
  Administered 2014-01-13: 09:00:00 via INTRAVENOUS

## 2014-01-13 MED ORDER — MIDAZOLAM HCL 5 MG/5ML IJ SOLN
INTRAMUSCULAR | Status: DC | PRN
  Administered 2014-01-13: 2 mg via INTRAVENOUS
  Administered 2014-01-13: 1 mg via INTRAVENOUS
  Administered 2014-01-13: 2 mg via INTRAVENOUS

## 2014-01-13 MED ORDER — MEPERIDINE HCL 50 MG/ML IJ SOLN
INTRAMUSCULAR | Status: DC | PRN
  Administered 2014-01-13: 25 mg via INTRAVENOUS

## 2014-01-13 NOTE — H&P (Signed)
[name] is an 53 y.o. female.   Chief Complaint: Patient is here for colonoscopy. HPI: 86 Caucasian female who is here for screening colonoscopy. She denies abdominal pain change in bowel habits or rectal bleeding.  Family history is negative for CRC.  Past Medical History  Diagnosis Date  . Hypertension   . Anxiety   . H/O estrogen therapy     Past Surgical History  Procedure Laterality Date  . Total abdominal hysterectomy  07/2005     for menorrhagia and ut. fibroids  . Bilateral salpingoophorectomy  2007  . Appendectomy  2007    had with bso  . Neck surgery  12/2010  . Carpal tunnel release Right     has had 3x  . Cesarean section      Family History  Problem Relation Age of Onset  . Parkinsonism Father   . Alzheimer's disease Father   . Heart attack Brother   . Diabetes Sister     borderline  . Cancer Sister     kidney  . Hypertension Sister   . COPD Mother   . Hypertension Brother   . Colon cancer Neg Hx    Social History:  reports that she has quit smoking. She has never used smokeless tobacco. She reports that she does not drink alcohol or use illicit drugs.  Allergies: No Known Allergies  Medications Prior to Admission  Medication Sig Dispense Refill  . busPIRone (BUSPAR) 5 MG tablet Take 1 tablet (5 mg total) by mouth 2 (two) times daily.  60 tablet  prn  . cholecalciferol (VITAMIN D) 1000 UNITS tablet Take 1,000 Units by mouth 2 (two) times daily. Take 2 daily      . estrogen-methylTESTOSTERone (ESTRATEST) 1.25-2.5 MG per tablet TAKE 1 TABLET BY MOUTH EVERY DAY  90 tablet  1  . lisinopril (PRINIVIL,ZESTRIL) 10 MG tablet Take 1 tablet (10 mg total) by mouth daily.  90 tablet  4  . peg 3350 powder (MOVIPREP) 100 G SOLR Take 1 kit (200 g total) by mouth once.  1 kit  0    No results found for this or any previous visit (from the past 48 hour(s)). No results found.  ROS  Blood pressure 116/46, pulse 61, temperature 97.7 F (36.5  C), temperature source Oral, resp. rate 20, height 5' 2.5" (1.588 m), weight 130 lb (58.968 kg), SpO2 100.00%. Physical Exam  Constitutional:  Well-developed thin Caucasian female in NAD  HENT:  Mouth/Throat: Oropharynx is clear and moist.  Eyes: No scleral icterus.  Neck: No thyromegaly present.  Cardiovascular: Normal rate, regular rhythm and normal heart sounds.   No murmur heard. Respiratory: Effort normal and breath sounds normal.  GI: Soft. She exhibits no distension and no mass. There is no tenderness.  Musculoskeletal: She exhibits no edema.  Lymphadenopathy:    She has no cervical adenopathy.  Neurological: She is alert.  Skin: Skin is warm and dry.     Assessment/Plan Average risk screening colonoscopy.  [name] 01/13/2014, 9:33 AM

## 2014-01-13 NOTE — Op Note (Signed)
COLONOSCOPY PROCEDURE REPORT  PATIENT:  [name]  MR#:  [id number] Birthdate:  11/22/1960, 53 y.o., female Endoscopist:  Dr. [name], MD Referred By:  Dr. Leonides Grills, MD  Procedure Date: 01/13/2014  Procedure:   Colonoscopy  Indications: Patient is 53 year old Caucasian female who is undergoing average risk screening colonoscopy.  Informed Consent:  The procedure and risks were reviewed with the patient and informed consent was obtained.  Medications:  Demerol 25 mg IV Versed 5 mg IV  Description of procedure:  After a digital rectal exam was performed, that colonoscope was advanced from the anus through the rectum and colon to the area of the cecum, ileocecal valve and appendiceal orifice. The cecum was deeply intubated. These structures were well-seen and photographed for the record. From the level of the cecum and ileocecal valve, the scope was slowly and cautiously withdrawn. The mucosal surfaces were carefully surveyed utilizing scope tip to flexion to facilitate fold flattening as needed. The scope was pulled down into the rectum where a thorough exam including retroflexion was performed.  Findings:   Prep excellent. Normal mucosa of the cecum, ascending colon, hepatic flexure, transverse colon, splenic flexure, descending and sigmoid colon. Normal mucosa of rectum. Hemorrhoids noted above and below the dentate line.   Therapeutic/Diagnostic Maneuvers Performed: None  Complications:  None  Cecal Withdrawal Time:  8 minutes  Impression:  Normal colonoscopy except internal/external hemorrhoids.  Recommendations:  Standard instructions given. Next screening exam in 10 years.  [name]  01/13/2014 10:06 AM  CC: Dr. Leonides Grills, MD & Dr. Rayne Du ref. provider found

## 2014-01-13 NOTE — Discharge Instructions (Signed)
Resume usual medications and diet. °No driving for 24 hours. °Next screening exam in 10 years. ° ° °Colonoscopy °Care After °These instructions give you information on caring for yourself after your procedure. Your doctor may also give you more specific instructions. Call your doctor if you have any problems or questions after your procedure. °HOME CARE °· Take it easy for the next 24 hours. °· Rest. °· Walk or use warm packs on your belly (abdomen) if you have belly cramping or gas. °· Do not drive for 24 hours. °· You may shower. °· Do not sign important papers or use machinery for 24 hours. °· Drink enough fluids to keep your pee (urine) clear or pale yellow. °· Resume your normal diet. Avoid heavy or fried foods. °· Avoid alcohol. °· Continue taking your normal medicines. °· Only take medicine as told by your doctor. Do not take aspirin. °If you had growths (polyps) removed: °· Do not take aspirin. °· Do not drink alcohol for 7 days or as told by your doctor. °· Eat a soft diet for 24 hours. °GET HELP RIGHT AWAY IF: °· You have a fever. °· You pass clumps of tissue (blood clots) or fill the toilet with blood. °· You have belly pain that gets worse and medicine does not help. °· Your belly is puffy (swollen). °· You feel sick to your stomach (nauseous) or throw up (vomit). °MAKE SURE YOU: °· Understand these instructions. °· Will watch your condition. °· Will get help right away if you are not doing well or get worse. °Document Released: 10/20/2010 Document Revised: 12/10/2011 Document Reviewed: 05/25/2013 °Exit[employer]® Patient Information ©2014 [employer]. ° °

## 2014-01-18 ENCOUNTER — Telehealth: Payer: Self-pay | Admitting: Adult Health

## 2014-01-18 ENCOUNTER — Encounter (HOSPITAL_COMMUNITY): Payer: Self-pay | Admitting: Internal Medicine

## 2014-01-18 NOTE — Telephone Encounter (Signed)
Complains of ?UTI vs vaginal infection, to come in in am for appt,she could not come today

## 2014-01-19 ENCOUNTER — Ambulatory Visit (INDEPENDENT_AMBULATORY_CARE_PROVIDER_SITE_OTHER): Payer: BC Managed Care – PPO | Admitting: Adult Health

## 2014-01-19 ENCOUNTER — Encounter: Payer: Self-pay | Admitting: Adult Health

## 2014-01-19 VITALS — BP 144/90 | Ht 62.0 in | Wt 130.0 lb

## 2014-01-19 DIAGNOSIS — R350 Frequency of micturition: Secondary | ICD-10-CM

## 2014-01-19 DIAGNOSIS — IMO0001 Reserved for inherently not codable concepts without codable children: Secondary | ICD-10-CM

## 2014-01-19 DIAGNOSIS — R300 Dysuria: Secondary | ICD-10-CM

## 2014-01-19 DIAGNOSIS — R309 Painful micturition, unspecified: Secondary | ICD-10-CM

## 2014-01-19 HISTORY — DX: Dysuria: R30.0

## 2014-01-19 LAB — POCT URINALYSIS DIPSTICK
Glucose, UA: NEGATIVE
Ketones, UA: NEGATIVE
Leukocytes, UA: NEGATIVE
NITRITE UA: NEGATIVE
PROTEIN UA: NEGATIVE
RBC UA: NEGATIVE

## 2014-01-19 MED ORDER — PHENAZOPYRIDINE HCL 200 MG PO TABS: 200.0000 mg | ORAL_TABLET | Freq: Three times a day (TID) | ORAL | Status: DC | PRN

## 2014-01-19 MED ORDER — SULFAMETHOXAZOLE-TMP DS 800-160 MG PO TABS: 1.0000 | ORAL_TABLET | Freq: Two times a day (BID) | ORAL | Status: DC

## 2014-01-19 NOTE — Progress Notes (Signed)
Subjective:     Patient ID: Desiree BustardSabrina B Walsh, female   DOB: 07/18/1961, 53 y.o.   MRN: [id number]  HPI [name] is a 53 year old white female, in complaining of pain with urination and frequency,burns and has low back pain, started 2 days ago, no fever.  Review of Systems See HPI Reviewed past medical,surgical, social and family history. Reviewed medications and allergies.     Objective:   Physical Exam BP 144/90  Ht 5\' 2"  (1.575 m)  Wt 130 lb (58.968 kg)  BMI 23.77 kg/m2   urine dipstick negative, Skin warm and dry.Pelvic: external genitalia is normal in appearance, vagina: pink,no discharge,the cervix and uterus are absent, adnexa: no masses or tenderness note.No CVAT.  Assessment:     Dysuria Urinary frequency     Plan:     UA C&S sent to lab Push fluids Rx septra ds 1 bid x 7 days,#14 no refills Rx pyridium 200 mg #10 1 tid no refills Review handout on UTI

## 2014-01-19 NOTE — Patient Instructions (Signed)
Urinary Tract Infection Urinary tract infections (UTIs) can develop anywhere along your urinary tract. Your urinary tract is your body's drainage system for removing wastes and extra water. Your urinary tract includes two kidneys, two ureters, a bladder, and a urethra. Your kidneys are a pair of bean-shaped organs. Each kidney is about the size of your fist. They are located below your ribs, one on each side of your spine. CAUSES Infections are caused by microbes, which are microscopic organisms, including fungi, viruses, and bacteria. These organisms are so small that they can only be seen through a microscope. Bacteria are the microbes that most commonly cause UTIs. SYMPTOMS  Symptoms of UTIs may vary by age and gender of the patient and by the location of the infection. Symptoms in young women typically include a frequent and intense urge to urinate and a painful, burning feeling in the bladder or urethra during urination. Older women and men are more likely to be tired, shaky, and weak and have muscle aches and abdominal pain. A fever may mean the infection is in your kidneys. Other symptoms of a kidney infection include pain in your back or sides below the ribs, nausea, and vomiting. DIAGNOSIS To diagnose a UTI, your caregiver will ask you about your symptoms. Your caregiver also will ask to provide a urine sample. The urine sample will be tested for bacteria and white blood cells. White blood cells are made by your body to help fight infection. TREATMENT  Typically, UTIs can be treated with medication. Because most UTIs are caused by a bacterial infection, they usually can be treated with the use of antibiotics. The choice of antibiotic and length of treatment depend on your symptoms and the type of bacteria causing your infection. HOME CARE INSTRUCTIONS  If you were prescribed antibiotics, take them exactly as your caregiver instructs you. Finish the medication even if you feel better after you  have only taken some of the medication.  Drink enough water and fluids to keep your urine clear or pale yellow.  Avoid caffeine, tea, and carbonated beverages. They tend to irritate your bladder.  Empty your bladder often. Avoid holding urine for long periods of time.  Empty your bladder before and after sexual intercourse.  After a bowel movement, women should cleanse from front to back. Use each tissue only once. SEEK MEDICAL CARE IF:   You have back pain.  You develop a fever.  Your symptoms do not begin to resolve within 3 days. SEEK IMMEDIATE MEDICAL CARE IF:   You have severe back pain or lower abdominal pain.  You develop chills.  You have nausea or vomiting.  You have continued burning or discomfort with urination. MAKE SURE YOU:   Understand these instructions.  Will watch your condition.  Will get help right away if you are not doing well or get worse. Document Released: 06/27/2005 Document Revised: 03/18/2012 Document Reviewed: 10/26/2011 [facility] Patient Information 2014 [place]. Push fluids, take meds

## 2014-01-20 LAB — URINALYSIS
BILIRUBIN URINE: NEGATIVE
GLUCOSE, UA: NEGATIVE mg/dL
HGB URINE DIPSTICK: NEGATIVE
KETONES UR: NEGATIVE mg/dL
Nitrite: NEGATIVE
PH: 6 (ref 5.0–8.0)
Protein, ur: NEGATIVE mg/dL
Urobilinogen, UA: 0.2 mg/dL (ref 0.0–1.0)

## 2014-01-20 LAB — URINE CULTURE
Colony Count: NO GROWTH
Organism ID, Bacteria: NO GROWTH

## 2014-01-22 ENCOUNTER — Telehealth: Payer: Self-pay | Admitting: Adult Health

## 2014-01-22 NOTE — Telephone Encounter (Signed)
Pt feels better, continue med

## 2014-02-10 ENCOUNTER — Telehealth: Payer: Self-pay | Admitting: *Deleted

## 2014-02-10 MED ORDER — BUSPIRONE HCL 5 MG PO TABS: 5.0000 mg | ORAL_TABLET | Freq: Two times a day (BID) | ORAL | Status: DC

## 2014-02-10 NOTE — Telephone Encounter (Signed)
Ordered 90 day supply

## 2014-08-02 ENCOUNTER — Encounter: Payer: Self-pay | Admitting: Adult Health

## 2014-08-06 ENCOUNTER — Other Ambulatory Visit: Payer: Self-pay | Admitting: Adult Health

## 2014-11-22 ENCOUNTER — Other Ambulatory Visit: Payer: Self-pay | Admitting: Adult Health

## 2014-11-22 DIAGNOSIS — Z1231 Encounter for screening mammogram for malignant neoplasm of breast: Secondary | ICD-10-CM

## 2014-12-02 ENCOUNTER — Encounter: Payer: Self-pay | Admitting: Adult Health

## 2014-12-02 ENCOUNTER — Ambulatory Visit (INDEPENDENT_AMBULATORY_CARE_PROVIDER_SITE_OTHER): Payer: BLUE CROSS/BLUE SHIELD | Admitting: Adult Health

## 2014-12-02 VITALS — BP 150/88 | HR 61 | Ht 65.0 in | Wt 137.0 lb

## 2014-12-02 DIAGNOSIS — F419 Anxiety disorder, unspecified: Secondary | ICD-10-CM

## 2014-12-02 DIAGNOSIS — Z79899 Other long term (current) drug therapy: Secondary | ICD-10-CM

## 2014-12-02 DIAGNOSIS — Z79818 Long term (current) use of other agents affecting estrogen receptors and estrogen levels: Secondary | ICD-10-CM

## 2014-12-02 DIAGNOSIS — I10 Essential (primary) hypertension: Secondary | ICD-10-CM | POA: Diagnosis not present

## 2014-12-02 HISTORY — DX: Long term (current) use of other agents affecting estrogen receptors and estrogen levels: Z79.818

## 2014-12-02 HISTORY — DX: Other long term (current) drug therapy: Z79.899

## 2014-12-02 MED ORDER — BUSPIRONE HCL 5 MG PO TABS: 5.0000 mg | ORAL_TABLET | Freq: Two times a day (BID) | ORAL | Status: DC

## 2014-12-02 MED ORDER — EST ESTROGENS-METHYLTEST 1.25-2.5 MG PO TABS: 1.0000 | ORAL_TABLET | Freq: Every day | ORAL | Status: DC

## 2014-12-02 MED ORDER — LISINOPRIL 10 MG PO TABS: 10.0000 mg | ORAL_TABLET | Freq: Every day | ORAL | Status: DC

## 2014-12-02 NOTE — Progress Notes (Signed)
Subjective:     Patient ID: [name], female   DOB: 1/7[name]/1962, 54 y.o.   MRN: [id number]  HPI [name] is a 54 year old white female, married in to get meds refilled, physical not due til after 3/16.She is doing well with ET and buspar, does get stressed when sister is over, is always wants to be busy.And she was at her house this morning.  Review of Systems  Patient denies any headaches, hearing loss, fatigue, blurred vision, shortness of breath, chest pain, abdominal pain, problems with bowel movements, urination, or intercourse. No joint pain or mood swings. Reviewed past medical,surgical, social and family history. Reviewed medications and allergies.     Objective:   Physical Exam BP 150/88 mmHg  Pulse 61  Ht 5\' 5"  (1.651 m)  Wt 137 lb (62.143 kg)  BMI 22.80 kg/m2 Skin warm and dry. Lungs: clear to ausculation bilaterally. Cardiovascular: regular rate and rhythm. BP recheck was 174/90 in right arm.    Assessment:     Hypertension Anxiety ET    Plan:     Refilled estratest 1.25-2.5 mg #90 with 1 refill take 1 daily Refilled lisinopril 10 mg x 1 year take 1 daily Refilled Buspar 5 mg #180 1 bid with 3 refills   Get mammogram 3/28 Return 3/30 for physical

## 2014-12-02 NOTE — Patient Instructions (Signed)
Continue meds  Return 3/30 for physical

## 2014-12-27 ENCOUNTER — Ambulatory Visit (HOSPITAL_COMMUNITY)
Admission: RE | Admit: 2014-12-27 | Discharge: 2014-12-27 | Disposition: A | Discharge status: Home / Self Care | Payer: BLUE CROSS/BLUE SHIELD | Source: Ambulatory Visit | Attending: Adult Health | Admitting: Adult Health

## 2014-12-27 DIAGNOSIS — Z1231 Encounter for screening mammogram for malignant neoplasm of breast: Secondary | ICD-10-CM

## 2014-12-29 ENCOUNTER — Ambulatory Visit (INDEPENDENT_AMBULATORY_CARE_PROVIDER_SITE_OTHER): Payer: BLUE CROSS/BLUE SHIELD | Admitting: Adult Health

## 2014-12-29 ENCOUNTER — Encounter: Payer: Self-pay | Admitting: Adult Health

## 2014-12-29 VITALS — BP 124/82 | HR 56 | Ht 62.25 in | Wt 132.5 lb

## 2014-12-29 DIAGNOSIS — Z1212 Encounter for screening for malignant neoplasm of rectum: Secondary | ICD-10-CM

## 2014-12-29 DIAGNOSIS — F419 Anxiety disorder, unspecified: Secondary | ICD-10-CM

## 2014-12-29 DIAGNOSIS — Z01419 Encounter for gynecological examination (general) (routine) without abnormal findings: Secondary | ICD-10-CM | POA: Diagnosis not present

## 2014-12-29 DIAGNOSIS — Z79899 Other long term (current) drug therapy: Secondary | ICD-10-CM

## 2014-12-29 DIAGNOSIS — I10 Essential (primary) hypertension: Secondary | ICD-10-CM

## 2014-12-29 LAB — HEMOCCULT GUIAC POC 1CARD (OFFICE): Fecal Occult Blood, POC: NEGATIVE

## 2014-12-29 NOTE — Progress Notes (Signed)
Patient ID: [name], female   DOB: [birth year], 54 y.o.   MRN: [id number] History of Present Illness: [name] is a 54 year old white female, married in for well woman gyn exam, she is sp hysterectomy.   Current Medications, Allergies, Past Medical History, Past Surgical History, Family History and Social History were reviewed in [employer] record.     Review of Systems: Patient denies any headaches, hearing loss, fatigue, blurred vision, shortness of breath, chest pain, abdominal pain, problems with bowel movements, urination, or intercourse. No joint pain or mood swings.She had mammogram 12/27/14 and colonoscopy 2015.Had labs this year with PCP.    Physical Exam:BP 124/82 mmHg  Pulse 56  Ht 5' 2.25" (1.581 m)  Wt 132 lb 8 oz (60.102 kg)  BMI 24.05 kg/m2 General:  Well developed, well nourished, no acute distress Skin:  Warm and dry Neck:  Midline trachea, normal thyroid, good ROM, no lymphadenopathy Lungs; Clear to auscultation bilaterally Breast:  No dominant palpable mass, retraction, or nipple discharge Cardiovascular: Regular rate and rhythm Abdomen:  Soft, non tender, no hepatosplenomegaly Pelvic:  External genitalia is normal in appearance, no lesions.  The vagina is pale with good moisture and there is a loss of rugae.Marland Kitchen Urethra has no lesions or masses. The cervix and uterus are absent. No adnexal masses or tenderness noted.Bladder is non tender, no masses felt. Rectal: Good sphincter tone, no polyps, or hemorrhoids felt.  Hemoccult negative. Extremities/musculoskeletal:  No swelling or varicosities noted, no clubbing or cyanosis Psych:  No mood changes, alert and cooperative,seems happy   Impression: Well woman gyn exam no pap ET Hypertension Anxiety     Plan: Continue meds has refills Mammogram yearly Colonoscopy 2015 Labs with PCP Physical in 1 year

## 2014-12-29 NOTE — Patient Instructions (Addendum)
Physical in 1 year Mammogram yearly  Labs with PCP Colonoscopy 2025

## 2015-01-05 ENCOUNTER — Other Ambulatory Visit (HOSPITAL_COMMUNITY): Payer: Self-pay | Admitting: Family Medicine

## 2015-01-05 ENCOUNTER — Ambulatory Visit (HOSPITAL_COMMUNITY)
Admission: RE | Admit: 2015-01-05 | Discharge: 2015-01-05 | Disposition: A | Discharge status: Home / Self Care | Payer: BLUE CROSS/BLUE SHIELD | Source: Ambulatory Visit | Attending: Family Medicine | Admitting: Family Medicine

## 2015-01-05 DIAGNOSIS — S3992XA Unspecified injury of lower back, initial encounter: Secondary | ICD-10-CM | POA: Diagnosis not present

## 2015-01-05 DIAGNOSIS — R937 Abnormal findings on diagnostic imaging of other parts of musculoskeletal system: Secondary | ICD-10-CM | POA: Insufficient documentation

## 2015-01-05 DIAGNOSIS — W19XXXA Unspecified fall, initial encounter: Secondary | ICD-10-CM | POA: Diagnosis not present

## 2015-01-05 DIAGNOSIS — M533 Sacrococcygeal disorders, not elsewhere classified: Secondary | ICD-10-CM

## 2015-07-30 ENCOUNTER — Other Ambulatory Visit: Payer: Self-pay | Admitting: Obstetrics & Gynecology

## 2015-10-06 ENCOUNTER — Ambulatory Visit (INDEPENDENT_AMBULATORY_CARE_PROVIDER_SITE_OTHER): Payer: BLUE CROSS/BLUE SHIELD | Admitting: Obstetrics & Gynecology

## 2015-10-06 ENCOUNTER — Encounter: Payer: Self-pay | Admitting: Obstetrics & Gynecology

## 2015-10-06 VITALS — BP 122/70 | HR 74 | Wt 123.0 lb

## 2015-10-06 DIAGNOSIS — R1030 Lower abdominal pain, unspecified: Secondary | ICD-10-CM | POA: Diagnosis not present

## 2015-10-06 DIAGNOSIS — N390 Urinary tract infection, site not specified: Secondary | ICD-10-CM | POA: Diagnosis not present

## 2015-10-06 DIAGNOSIS — M545 Low back pain: Secondary | ICD-10-CM

## 2015-10-06 LAB — POCT URINALYSIS DIPSTICK
GLUCOSE UA: NEGATIVE
Ketones, UA: NEGATIVE
LEUKOCYTES UA: NEGATIVE
NITRITE UA: NEGATIVE
Protein, UA: NEGATIVE
RBC UA: NEGATIVE

## 2015-10-06 MED ORDER — SULFAMETHOXAZOLE-TRIMETHOPRIM 800-160 MG PO TABS: 1.0000 | ORAL_TABLET | Freq: Two times a day (BID) | ORAL | Status: DC

## 2015-10-06 NOTE — Progress Notes (Signed)
Patient ID: [name], female   DOB: 07/20/1961, 55 y.o.   MRN: [id number]      Chief Complaint  Patient presents with  . gyn visit    hurt in lower back and lower abdomen x 1day..    Blood pressure 122/70, pulse 74, weight 123 lb (55.792 kg).  56 y.o. G2P2 No LMP recorded. Patient has had a hysterectomy. The current method of family planning is status post hysterectomy.  Subjective Lower pain pelvic pressure back pain last few days  Objective Abdomen soft minimal tenderness no guarding  Pertinent ROS  No nausea, vomiting or diarrhea Nor fever chills or other constitutional symptoms   Labs or studies pending    Impression Diagnoses this Encounter::   ICD-9-CM ICD-10-CM   1. UTI (lower urinary tract infection) 599.0 N39.0   2. Low back pain without sciatica, unspecified back pain laterality 724.2 M54.5 POCT urinalysis dipstick     Urine culture    Established relevant diagnosis(es):   Plan/Recommendations: Meds ordered this encounter  Medications  . sulfamethoxazole-trimethoprim (BACTRIM DS,SEPTRA DS) 800-160 MG tablet    Sig: Take 1 tablet by mouth 2 (two) times daily.    Dispense:  14 tablet    Refill:  0    Labs or Scans Ordered: Orders Placed This Encounter  Procedures  . Urine culture  . POCT urinalysis dipstick    Management::   Follow up  Return if symptoms worsen or fail to improve.        Face to face time:  15 minutes  Greater than 50% of the visit time was spent in counseling and coordination of care with the patient.  The summary and outline of the counseling and care coordination is summarized in the note above.   All questions were answered.

## 2015-10-08 LAB — URINE CULTURE: ORGANISM ID, BACTERIA: NO GROWTH

## 2015-12-16 ENCOUNTER — Other Ambulatory Visit: Payer: Self-pay | Admitting: Adult Health

## 2015-12-16 DIAGNOSIS — Z1231 Encounter for screening mammogram for malignant neoplasm of breast: Secondary | ICD-10-CM

## 2015-12-21 ENCOUNTER — Ambulatory Visit (HOSPITAL_COMMUNITY)
Admission: RE | Admit: 2015-12-21 | Discharge: 2015-12-21 | Disposition: A | Discharge status: Home / Self Care | Payer: BLUE CROSS/BLUE SHIELD | Source: Ambulatory Visit | Attending: Adult Health | Admitting: Adult Health

## 2015-12-21 DIAGNOSIS — Z1231 Encounter for screening mammogram for malignant neoplasm of breast: Secondary | ICD-10-CM | POA: Insufficient documentation

## 2015-12-21 DIAGNOSIS — R928 Other abnormal and inconclusive findings on diagnostic imaging of breast: Secondary | ICD-10-CM | POA: Insufficient documentation

## 2015-12-23 ENCOUNTER — Other Ambulatory Visit: Payer: Self-pay | Admitting: Adult Health

## 2015-12-23 DIAGNOSIS — R928 Other abnormal and inconclusive findings on diagnostic imaging of breast: Secondary | ICD-10-CM

## 2015-12-27 ENCOUNTER — Other Ambulatory Visit (HOSPITAL_COMMUNITY): Payer: Self-pay | Admitting: Adult Health

## 2015-12-27 DIAGNOSIS — R928 Other abnormal and inconclusive findings on diagnostic imaging of breast: Secondary | ICD-10-CM

## 2016-01-03 ENCOUNTER — Encounter: Payer: Self-pay | Admitting: Adult Health

## 2016-01-03 ENCOUNTER — Ambulatory Visit (INDEPENDENT_AMBULATORY_CARE_PROVIDER_SITE_OTHER): Payer: BLUE CROSS/BLUE SHIELD | Admitting: Adult Health

## 2016-01-03 ENCOUNTER — Other Ambulatory Visit: Payer: Self-pay | Admitting: Adult Health

## 2016-01-03 ENCOUNTER — Ambulatory Visit (HOSPITAL_COMMUNITY)
Admission: RE | Admit: 2016-01-03 | Discharge: 2016-01-03 | Disposition: A | Discharge status: Home / Self Care | Payer: BLUE CROSS/BLUE SHIELD | Source: Ambulatory Visit | Attending: Adult Health | Admitting: Adult Health

## 2016-01-03 VITALS — BP 130/70 | HR 64 | Ht 62.5 in | Wt 119.0 lb

## 2016-01-03 DIAGNOSIS — Z1211 Encounter for screening for malignant neoplasm of colon: Secondary | ICD-10-CM

## 2016-01-03 DIAGNOSIS — M545 Low back pain: Secondary | ICD-10-CM | POA: Diagnosis not present

## 2016-01-03 DIAGNOSIS — I10 Essential (primary) hypertension: Secondary | ICD-10-CM

## 2016-01-03 DIAGNOSIS — Z01411 Encounter for gynecological examination (general) (routine) with abnormal findings: Secondary | ICD-10-CM | POA: Diagnosis not present

## 2016-01-03 DIAGNOSIS — Z79899 Other long term (current) drug therapy: Secondary | ICD-10-CM

## 2016-01-03 DIAGNOSIS — Z01419 Encounter for gynecological examination (general) (routine) without abnormal findings: Secondary | ICD-10-CM

## 2016-01-03 DIAGNOSIS — R928 Other abnormal and inconclusive findings on diagnostic imaging of breast: Secondary | ICD-10-CM

## 2016-01-03 DIAGNOSIS — M5430 Sciatica, unspecified side: Secondary | ICD-10-CM

## 2016-01-03 DIAGNOSIS — M5440 Lumbago with sciatica, unspecified side: Secondary | ICD-10-CM

## 2016-01-03 DIAGNOSIS — F419 Anxiety disorder, unspecified: Secondary | ICD-10-CM

## 2016-01-03 HISTORY — DX: Lumbago with sciatica, unspecified side: M54.40

## 2016-01-03 LAB — HEMOCCULT GUIAC POC 1CARD (OFFICE): Fecal Occult Blood, POC: NEGATIVE

## 2016-01-03 MED ORDER — LISINOPRIL 10 MG PO TABS: 10.0000 mg | ORAL_TABLET | Freq: Every day | ORAL | Status: DC

## 2016-01-03 MED ORDER — EST ESTROGENS-METHYLTEST 1.25-2.5 MG PO TABS: 1.0000 | ORAL_TABLET | Freq: Every day | ORAL | Status: DC

## 2016-01-03 MED ORDER — BUSPIRONE HCL 5 MG PO TABS: 5.0000 mg | ORAL_TABLET | Freq: Two times a day (BID) | ORAL | Status: DC
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
## 2016-01-03 NOTE — Patient Instructions (Signed)
Physical in 1 year Labs today Mammogram yearly MR 4/11 at [facility] at 5 pm  Colonoscopy per GI

## 2016-01-04 LAB — COMPREHENSIVE METABOLIC PANEL
ALK PHOS: 47 IU/L (ref 39–117)
ALT: 18 IU/L (ref 0–32)
AST: 24 IU/L (ref 0–40)
Albumin/Globulin Ratio: 1.6 (ref 1.2–2.2)
Albumin: 4.1 g/dL (ref 3.5–5.5)
BUN/Creatinine Ratio: 7 — ABNORMAL LOW (ref 9–23)
BUN: 8 mg/dL (ref 6–24)
Bilirubin Total: 0.3 mg/dL (ref 0.0–1.2)
CO2: 25 mmol/L (ref 18–29)
Calcium: 9.2 mg/dL (ref 8.7–10.2)
Chloride: 92 mmol/L — ABNORMAL LOW (ref 96–106)
Creatinine, Ser: 1.11 mg/dL — ABNORMAL HIGH (ref 0.57–1.00)
GFR calc Af Amer: 65 mL/min/{1.73_m2} (ref >59)
GFR, EST NON AFRICAN AMERICAN: 56 mL/min/{1.73_m2} — AB (ref >59)
GLOBULIN, TOTAL: 2.6 g/dL (ref 1.5–4.5)
GLUCOSE: 80 mg/dL (ref 65–99)
Potassium: 5.1 mmol/L (ref 3.5–5.2)
Sodium: 131 mmol/L — ABNORMAL LOW (ref 134–144)
Total Protein: 6.7 g/dL (ref 6.0–8.5)

## 2016-01-04 LAB — CBC
HEMATOCRIT: 35.5 % (ref 34.0–46.6)
Hemoglobin: 11.2 g/dL (ref 11.1–15.9)
MCH: 27.5 pg (ref 26.6–33.0)
MCHC: 31.5 g/dL (ref 31.5–35.7)
MCV: 87 fL (ref 79–97)
PLATELETS: 360 10*3/uL (ref 150–379)
RBC: 4.08 x10E6/uL (ref 3.77–5.28)
RDW: 15.7 % — ABNORMAL HIGH (ref 12.3–15.4)
WBC: 7.3 10*3/uL (ref 3.4–10.8)

## 2016-01-04 LAB — VITAMIN D 25 HYDROXY (VIT D DEFICIENCY, FRACTURES): Vit D, 25-Hydroxy: 57.5 ng/mL (ref 30.0–100.0)

## 2016-01-04 LAB — LIPID PANEL
CHOLESTEROL TOTAL: 161 mg/dL (ref 100–199)
Chol/HDL Ratio: 5.6 ratio units — ABNORMAL HIGH (ref 0.0–4.4)
HDL: 29 mg/dL — ABNORMAL LOW (ref >39)
LDL Calculated: 111 mg/dL — ABNORMAL HIGH (ref 0–99)
Triglycerides: 103 mg/dL (ref 0–149)
VLDL Cholesterol Cal: 21 mg/dL (ref 5–40)

## 2016-01-04 LAB — TSH: TSH: 0.803 u[IU]/mL (ref 0.450–4.500)

## 2016-01-04 LAB — HEMOGLOBIN A1C
ESTIMATED AVERAGE GLUCOSE: 117 mg/dL
Hgb A1c MFr Bld: 5.7 % — ABNORMAL HIGH (ref 4.8–5.6)

## 2016-01-05 ENCOUNTER — Telehealth: Payer: Self-pay | Admitting: Adult Health

## 2016-01-05 NOTE — Telephone Encounter (Signed)
Left message I was calling about labs

## 2016-01-10 ENCOUNTER — Ambulatory Visit
Admission: RE | Admit: 2016-01-10 | Discharge: 2016-01-10 | Disposition: A | Discharge status: Home / Self Care | Payer: BLUE CROSS/BLUE SHIELD | Source: Ambulatory Visit | Attending: Adult Health | Admitting: Adult Health

## 2016-01-10 ENCOUNTER — Ambulatory Visit (HOSPITAL_COMMUNITY)
Admission: RE | Admit: 2016-01-10 | Discharge: 2016-01-10 | Disposition: A | Discharge status: Home / Self Care | Payer: BLUE CROSS/BLUE SHIELD | Source: Ambulatory Visit | Attending: Adult Health | Admitting: Adult Health

## 2016-01-10 DIAGNOSIS — M4806 Spinal stenosis, lumbar region: Secondary | ICD-10-CM | POA: Insufficient documentation

## 2016-01-10 DIAGNOSIS — M5136 Other intervertebral disc degeneration, lumbar region: Secondary | ICD-10-CM | POA: Diagnosis not present

## 2016-01-10 DIAGNOSIS — M545 Low back pain: Secondary | ICD-10-CM | POA: Diagnosis not present

## 2016-01-10 DIAGNOSIS — R928 Other abnormal and inconclusive findings on diagnostic imaging of breast: Secondary | ICD-10-CM

## 2016-01-10 DIAGNOSIS — M5440 Lumbago with sciatica, unspecified side: Secondary | ICD-10-CM

## 2016-01-11 ENCOUNTER — Encounter: Payer: Self-pay | Admitting: Adult Health

## 2016-01-11 ENCOUNTER — Telehealth: Payer: Self-pay | Admitting: Adult Health

## 2016-01-11 DIAGNOSIS — N6019 Diffuse cystic mastopathy of unspecified breast: Secondary | ICD-10-CM

## 2016-01-11 DIAGNOSIS — M47817 Spondylosis without myelopathy or radiculopathy, lumbosacral region: Secondary | ICD-10-CM

## 2016-01-11 HISTORY — DX: Spondylosis without myelopathy or radiculopathy, lumbosacral region: M47.817

## 2016-01-11 HISTORY — DX: Diffuse cystic mastopathy of unspecified breast: N60.19

## 2016-01-11 MED ORDER — SIMVASTATIN 10 MG PO TABS
ORAL_TABLET | ORAL | Status: DC
Start: 2016-01-11 — End: 2016-02-09

## 2016-01-11 NOTE — Telephone Encounter (Signed)
Pt aware of labs and need to get HDL up and that breast bx was fibrocystic changes and that MRI shows DJD and impingement L5, [place]

## 2016-02-09 ENCOUNTER — Other Ambulatory Visit: Payer: Self-pay | Admitting: *Deleted

## 2016-02-09 MED ORDER — SIMVASTATIN 10 MG PO TABS: ORAL_TABLET | ORAL | Status: DC

## 2016-02-25 ENCOUNTER — Other Ambulatory Visit: Payer: Self-pay | Admitting: Adult Health

## 2016-03-01 ENCOUNTER — Other Ambulatory Visit: Payer: Self-pay | Admitting: Adult Health

## 2016-05-21 ENCOUNTER — Telehealth: Payer: Self-pay | Admitting: Adult Health

## 2016-05-21 NOTE — Telephone Encounter (Signed)
Pt informed labs ordered for fasting labs for tomorrow at [place].

## 2016-05-22 ENCOUNTER — Other Ambulatory Visit: Payer: BLUE CROSS/BLUE SHIELD

## 2016-05-22 DIAGNOSIS — Z01419 Encounter for gynecological examination (general) (routine) without abnormal findings: Secondary | ICD-10-CM

## 2016-05-23 ENCOUNTER — Telehealth: Payer: Self-pay | Admitting: Adult Health

## 2016-05-23 LAB — COMPREHENSIVE METABOLIC PANEL
ALT: 16 IU/L (ref 0–32)
AST: 17 IU/L (ref 0–40)
Albumin/Globulin Ratio: 1.9 (ref 1.2–2.2)
Albumin: 3.9 g/dL (ref 3.5–5.5)
Alkaline Phosphatase: 31 IU/L — ABNORMAL LOW (ref 39–117)
BUN/Creatinine Ratio: 9 (ref 9–23)
BUN: 11 mg/dL (ref 6–24)
Bilirubin Total: 0.3 mg/dL (ref 0.0–1.2)
CALCIUM: 9.2 mg/dL (ref 8.7–10.2)
CO2: 22 mmol/L (ref 18–29)
Chloride: 92 mmol/L — ABNORMAL LOW (ref 96–106)
Creatinine, Ser: 1.27 mg/dL — ABNORMAL HIGH (ref 0.57–1.00)
GFR, EST AFRICAN AMERICAN: 55 mL/min/{1.73_m2} — AB (ref >59)
GFR, EST NON AFRICAN AMERICAN: 48 mL/min/{1.73_m2} — AB (ref >59)
GLUCOSE: 86 mg/dL (ref 65–99)
Globulin, Total: 2.1 g/dL (ref 1.5–4.5)
POTASSIUM: 5.6 mmol/L — AB (ref 3.5–5.2)
Sodium: 133 mmol/L — ABNORMAL LOW (ref 134–144)
TOTAL PROTEIN: 6 g/dL (ref 6.0–8.5)

## 2016-05-23 LAB — LIPID PANEL
CHOL/HDL RATIO: 2.9 ratio (ref 0.0–4.4)
Cholesterol, Total: 96 mg/dL — ABNORMAL LOW (ref 100–199)
HDL: 33 mg/dL — AB (ref >39)
LDL Calculated: 48 mg/dL (ref 0–99)
TRIGLYCERIDES: 74 mg/dL (ref 0–149)
VLDL CHOLESTEROL CAL: 15 mg/dL (ref 5–40)

## 2016-05-23 NOTE — Telephone Encounter (Signed)
Left message I called about labs

## 2016-05-25 ENCOUNTER — Telehealth: Payer: Self-pay | Admitting: Adult Health

## 2016-05-25 NOTE — Telephone Encounter (Signed)
Left message to call me back about labs

## 2016-05-28 ENCOUNTER — Telehealth: Payer: Self-pay | Admitting: Adult Health

## 2016-05-28 DIAGNOSIS — R7989 Other specified abnormal findings of blood chemistry: Secondary | ICD-10-CM

## 2016-05-28 NOTE — Telephone Encounter (Signed)
Pt aware creatinine elevated will recheck in 4 weeks, increase fluids

## 2016-06-28 LAB — COMPREHENSIVE METABOLIC PANEL
ALBUMIN: 4.2 g/dL (ref 3.5–5.5)
ALK PHOS: 36 IU/L — AB (ref 39–117)
ALT: 14 IU/L (ref 0–32)
AST: 15 IU/L (ref 0–40)
Albumin/Globulin Ratio: 1.8 (ref 1.2–2.2)
BILIRUBIN TOTAL: 0.3 mg/dL (ref 0.0–1.2)
BUN / CREAT RATIO: 8 — AB (ref 9–23)
BUN: 10 mg/dL (ref 6–24)
CHLORIDE: 88 mmol/L — AB (ref 96–106)
CO2: 23 mmol/L (ref 18–29)
Calcium: 9.4 mg/dL (ref 8.7–10.2)
Creatinine, Ser: 1.19 mg/dL — ABNORMAL HIGH (ref 0.57–1.00)
GFR calc Af Amer: 59 mL/min/{1.73_m2} — ABNORMAL LOW (ref >59)
GFR calc non Af Amer: 52 mL/min/{1.73_m2} — ABNORMAL LOW (ref >59)
GLOBULIN, TOTAL: 2.3 g/dL (ref 1.5–4.5)
GLUCOSE: 100 mg/dL — AB (ref 65–99)
Potassium: 4.9 mmol/L (ref 3.5–5.2)
SODIUM: 130 mmol/L — AB (ref 134–144)
Total Protein: 6.5 g/dL (ref 6.0–8.5)

## 2016-07-05 ENCOUNTER — Telehealth: Payer: Self-pay | Admitting: Adult Health

## 2016-07-05 ENCOUNTER — Telehealth: Payer: Self-pay | Admitting: *Deleted

## 2016-07-05 NOTE — Telephone Encounter (Signed)
Left message to call about labs

## 2016-07-06 NOTE — Telephone Encounter (Signed)
Pt aware creatinine better, make sure staying hydrated and recheck in 6 months, place in recall

## 2016-07-24 ENCOUNTER — Other Ambulatory Visit (HOSPITAL_COMMUNITY): Payer: Self-pay | Admitting: Neurological Surgery

## 2016-07-24 ENCOUNTER — Other Ambulatory Visit: Payer: Self-pay | Admitting: Neurological Surgery

## 2016-07-24 DIAGNOSIS — M4316 Spondylolisthesis, lumbar region: Secondary | ICD-10-CM

## 2016-07-28 ENCOUNTER — Other Ambulatory Visit: Payer: Self-pay | Admitting: Adult Health

## 2016-08-08 ENCOUNTER — Encounter (HOSPITAL_COMMUNITY): Payer: Self-pay

## 2016-08-08 ENCOUNTER — Ambulatory Visit (HOSPITAL_COMMUNITY)
Admission: RE | Admit: 2016-08-08 | Discharge: 2016-08-08 | Disposition: A | Discharge status: Home / Self Care | Payer: BLUE CROSS/BLUE SHIELD | Source: Ambulatory Visit | Attending: Neurological Surgery | Admitting: Neurological Surgery

## 2016-08-08 DIAGNOSIS — M4726 Other spondylosis with radiculopathy, lumbar region: Secondary | ICD-10-CM | POA: Diagnosis not present

## 2016-08-08 DIAGNOSIS — M48061 Spinal stenosis, lumbar region without neurogenic claudication: Secondary | ICD-10-CM | POA: Insufficient documentation

## 2016-08-08 DIAGNOSIS — M4316 Spondylolisthesis, lumbar region: Secondary | ICD-10-CM | POA: Insufficient documentation

## 2016-08-08 DIAGNOSIS — M47896 Other spondylosis, lumbar region: Secondary | ICD-10-CM | POA: Insufficient documentation

## 2016-08-08 MED ORDER — DIAZEPAM 5 MG PO TABS
10.0000 mg | ORAL_TABLET | Freq: Once | ORAL | Status: AC
  Administered 2016-08-08: 10 mg via ORAL

## 2016-08-08 MED ORDER — IOPAMIDOL (ISOVUE-M 200) INJECTION 41%
INTRAMUSCULAR | Status: AC
  Administered 2016-08-08: 12 mL via INTRATHECAL
  Filled 2016-08-08: qty 10

## 2016-08-08 MED ORDER — HYDROCODONE-ACETAMINOPHEN 5-325 MG PO TABS
1.0000 | ORAL_TABLET | ORAL | Status: DC | PRN
  Administered 2016-08-08: 1 via ORAL

## 2016-08-08 MED ORDER — HYDROCODONE-ACETAMINOPHEN 5-325 MG PO TABS
ORAL_TABLET | ORAL | Status: AC
  Filled 2016-08-08: qty 1

## 2016-08-08 MED ORDER — LIDOCAINE HCL 1 % IJ SOLN
INTRAMUSCULAR | Status: AC
  Filled 2016-08-08: qty 10

## 2016-08-08 MED ORDER — LIDOCAINE HCL (PF) 1 % IJ SOLN
5.0000 mL | Freq: Once | INTRAMUSCULAR | Status: AC
  Administered 2016-08-08: 5 mL via INTRADERMAL

## 2016-08-08 MED ORDER — HYDROCODONE-ACETAMINOPHEN 5-325 MG PO TABS
1.0000 | ORAL_TABLET | Freq: Once | ORAL | Status: AC
  Administered 2016-08-08: 1 via ORAL

## 2016-08-08 MED ORDER — ONDANSETRON HCL 4 MG/2ML IJ SOLN: 4.0000 mg | Freq: Four times a day (QID) | INTRAMUSCULAR | Status: DC | PRN

## 2016-08-08 MED ORDER — IOPAMIDOL (ISOVUE-M 200) INJECTION 41%
20.0000 mL | Freq: Once | INTRAMUSCULAR | Status: AC
  Administered 2016-08-08: 12 mL via INTRATHECAL

## 2016-08-08 MED ORDER — DIAZEPAM 5 MG PO TABS
ORAL_TABLET | ORAL | Status: AC
  Administered 2016-08-08: 10 mg via ORAL
  Filled 2016-08-08: qty 2

## 2016-08-08 NOTE — Discharge Instructions (Signed)
Myelography, Care After °These instructions give you information on caring for yourself after your procedure. Your doctor may also give you more specific instructions. Call your doctor if you have any problems or questions after your procedure. °HOME CARE °· Rest the first day. °· When you rest, lie flat, with your head slightly raised (elevated). °· Avoid heavy lifting and activity for 48 hours, or as told by your doctor. °· You may take the bandage (dressing) off one day after the test, or as told by your doctor. °· Take all medicines only as told by your doctor. °· Ask your doctor when it is okay to take a shower or bath. °· Ask your doctor when your test results will be ready and how you can get them. Make sure you follow up and get your results. °· Do not drink alcohol for 24 hours, or as told by your doctor. °· Drink enough fluid to keep your pee (urine) clear or pale yellow. °GET HELP IF:  °· You have a fever. °· You have a headache. °· You feel sick to your stomach (nauseous) or throw up (vomit). °· You have pain or cramping in your belly (abdomen). °GET HELP RIGHT AWAY IF:  °· You have a headache with a stiff neck or fever. °· You have trouble breathing. °· Any of the places where the needles were put in are: °¨ Puffy (swollen) or red. °¨ Sore or hot to the touch. °¨ Draining yellowish-white fluid (pus). °¨ Bleeding. °MAKE SURE YOU: °· Understand these instructions. °· Will watch your condition. °· Will get help right away if you are not doing well or get worse. °  °This information is not intended to replace advice given to you by your health care provider. Make sure you discuss any questions you have with your health care provider. °  °Document Released: 06/26/2008 Document Revised: 10/08/2014 Document Reviewed: 06/11/2012 °Elsevier Interactive Patient Education ©2016 Elsevier Inc. ° °

## 2016-08-08 NOTE — Procedures (Signed)
[name] is a 55 year old individual who has had significant back pain with intermittent lumbar radiculopathy had previously responded well to conservative efforts with intermittent epidural steroid injections. Over the past number of months she's noted that the pain has returned much more readily and she is plagued now with significant radicular pain particularly prevents her from sleeping or resting well she notes that she will Richrd Sox turned my unable to get comfortable with primarily leg pain and a component of back pain. Her previous MRIs demonstrate that she has degenerative changes and grade 1 spondylolisthesis at the L4-L5 level. She also has degenerative retrolisthesis at L2-3 and has been suggested that she undergo myelography for further evaluation of this process.  Pre op Dx: Lumbar spondylolisthesis L4-L5 with radiculopathy Post op Dx: Same Procedure: Lumbar myelogram Surgeon: Terril Amaro Puncture level: Isovue-200, 10 mL L2-3 Fluid color: Clear colorless Injection: Isovue-200, 10 mL Findings: Moderately severe lateral recess stenosis L4-L5 degenerative retrolisthesis L2-L3. Further evaluation with CT scanning.

## 2016-08-16 ENCOUNTER — Other Ambulatory Visit: Payer: Self-pay | Admitting: Neurological Surgery

## 2016-09-11 ENCOUNTER — Encounter (HOSPITAL_COMMUNITY): Payer: Self-pay

## 2016-09-11 ENCOUNTER — Encounter (HOSPITAL_COMMUNITY)
Admission: RE | Admit: 2016-09-11 | Discharge: 2016-09-11 | Disposition: A | Discharge status: Home / Self Care | Payer: BLUE CROSS/BLUE SHIELD | Source: Ambulatory Visit | Attending: Neurological Surgery | Admitting: Neurological Surgery

## 2016-09-11 DIAGNOSIS — Z79899 Other long term (current) drug therapy: Secondary | ICD-10-CM | POA: Insufficient documentation

## 2016-09-11 DIAGNOSIS — Z01818 Encounter for other preprocedural examination: Secondary | ICD-10-CM | POA: Insufficient documentation

## 2016-09-11 DIAGNOSIS — Z0183 Encounter for blood typing: Secondary | ICD-10-CM | POA: Insufficient documentation

## 2016-09-11 DIAGNOSIS — I10 Essential (primary) hypertension: Secondary | ICD-10-CM | POA: Insufficient documentation

## 2016-09-11 DIAGNOSIS — M4316 Spondylolisthesis, lumbar region: Secondary | ICD-10-CM | POA: Insufficient documentation

## 2016-09-11 DIAGNOSIS — Z01812 Encounter for preprocedural laboratory examination: Secondary | ICD-10-CM | POA: Diagnosis not present

## 2016-09-11 DIAGNOSIS — K219 Gastro-esophageal reflux disease without esophagitis: Secondary | ICD-10-CM | POA: Diagnosis not present

## 2016-09-11 DIAGNOSIS — Z87891 Personal history of nicotine dependence: Secondary | ICD-10-CM | POA: Diagnosis not present

## 2016-09-11 HISTORY — DX: Gastro-esophageal reflux disease without esophagitis: K21.9

## 2016-09-11 HISTORY — DX: Other complications of anesthesia, initial encounter: T88.59XA

## 2016-09-11 HISTORY — DX: Adverse effect of unspecified anesthetic, initial encounter: T41.45XA

## 2016-09-11 LAB — TYPE AND SCREEN
ABO/RH(D): A POS
Antibody Screen: NEGATIVE

## 2016-09-11 LAB — CBC
HCT: 32.5 % — ABNORMAL LOW (ref 36.0–46.0)
Hemoglobin: 9.9 g/dL — ABNORMAL LOW (ref 12.0–15.0)
MCH: 25.5 pg — ABNORMAL LOW (ref 26.0–34.0)
MCHC: 30.5 g/dL (ref 30.0–36.0)
MCV: 83.8 fL (ref 78.0–100.0)
PLATELETS: 319 10*3/uL (ref 150–400)
RBC: 3.88 MIL/uL (ref 3.87–5.11)
RDW: 17.5 % — AB (ref 11.5–15.5)
WBC: 8.7 10*3/uL (ref 4.0–10.5)

## 2016-09-11 LAB — BASIC METABOLIC PANEL
ANION GAP: 9 (ref 5–15)
BUN: 5 mg/dL — AB (ref 6–20)
CALCIUM: 8.8 mg/dL — AB (ref 8.9–10.3)
CO2: 25 mmol/L (ref 22–32)
Chloride: 99 mmol/L — ABNORMAL LOW (ref 101–111)
Creatinine, Ser: 1.08 mg/dL — ABNORMAL HIGH (ref 0.44–1.00)
GFR calc Af Amer: >60 mL/min (ref >60)
GFR calc non Af Amer: 57 mL/min — ABNORMAL LOW (ref >60)
GLUCOSE: 85 mg/dL (ref 65–99)
POTASSIUM: 4.3 mmol/L (ref 3.5–5.1)
SODIUM: 133 mmol/L — AB (ref 135–145)

## 2016-09-11 LAB — SURGICAL PCR SCREEN
MRSA, PCR: NEGATIVE
STAPHYLOCOCCUS AUREUS: NEGATIVE

## 2016-09-11 LAB — ABO/RH: ABO/RH(D): A POS

## 2016-09-11 NOTE — Pre-Procedure Instructions (Addendum)
[name]  09/11/2016      [id number] - [place], [place] - [address] AT [facility] [address] [place] [id number] Phone: [phone] Fax: [phone]    Your procedure is scheduled on Monday, December 18.  [facility] Admitting at 5:30 AM                Your surgery or procedure is scheduled for 7:30 AM              Call this number if you have problems the morning of surgery:[phone]              For any other questions, please call [phone], Monday - Friday 8 AM - 4 PM.   Remember:  Do not eat food or drink liquids after midnight Sunday, December 17  Take these medicines the morning of surgery with A SIP OF WATER :busPIRone (BUSPAR), estrogens-methylTEST (ESTRATEST). Robaxin if needed  STOP NOW taking Aspirin, Aspirin Products (Goody Powder, Excedrin Migraine), Ibuprofen (Advil), Naproxen (Aleve), Vitamins and Herbal Products (ie Fish Oil)               [facility]- Preparing For Surgery  Before surgery, you can play an important role. Because skin is not sterile, your skin needs to be as free of germs as possible. You can reduce the number of germs on your skin by washing with CHG (chlorahexidine gluconate) Soap before surgery.  CHG is an antiseptic cleaner which kills germs and bonds with the skin to continue killing germs even after washing.  Please do not use if you have an allergy to CHG or antibacterial soaps. If your skin becomes reddened/irritated stop using the CHG.  Do not shave (including legs and underarms) for at least 48 hours prior to first CHG shower. It is OK to shave your face.  Please follow these instructions carefully.   1. Shower the NIGHT BEFORE SURGERY and the MORNING OF SURGERY with CHG.   2. If you chose to wash your hair, wash your hair first as usual with your normal shampoo.  3. After you shampoo, rinse your hair and body thoroughly to remove the shampoo.  4. Use CHG as you would any  other liquid soap. You can apply CHG directly to the skin and wash gently with a scrungie or a clean washcloth.   5. Apply the CHG Soap to your body ONLY FROM THE NECK DOWN.  Do not use on open wounds or open sores. Avoid contact with your eyes, ears, mouth and genitals (private parts). Wash genitals (private parts) with your normal soap.  6. Wash thoroughly, paying special attention to the area where your surgery will be performed.  7. Thoroughly rinse your body with warm water from the neck down.  8. DO NOT shower/wash with your normal soap after using and rinsing off the CHG Soap.  9. Pat yourself dry with a CLEAN TOWEL.   10. Wear CLEAN PAJAMAS   11. Place CLEAN SHEETS on your bed the night of your first shower and DO NOT SLEEP WITH PETS.  Day of Surgery: Do not apply any deodorants/lotions. Please wear clean clothes to the hospital/surgery center.     Do not wear jewelry, make-up or nail polish.  Do not wear lotions, powders, or perfumes, or deodorant.  Do not shave 48 hours prior to surgery.    Do not bring valuables to the hospital.  [facility] is not responsible  for any belongings or valuables.  Contacts, dentures or bridgework may not be worn into surgery.  Leave your suitcase in the car.  After surgery it may be brought to your room.  For patients admitted to the hospital, discharge time will be determined by your treatment team.  Patients discharged the day of surgery will not be allowed to drive home.   Special instructions:-  Review Handouts:  Patient Instructions for Mupirocin Application, Incentive Spirometry/  Pain Booklet

## 2016-09-12 NOTE — Progress Notes (Signed)
Anesthesia Chart Review:  Pt is a 55 year old female scheduled for L4-5 PLIF on 09/17/2016 with Kristeen Miss, MD.   - PCP is Sharilyn Sites, MD.   PMH includes:  HTN, GERD. Former smoker. BMI 19  Medications include: lisinopril  Preoperative labs reviewed.  H/H 9.9/32.5.  Prior hgb was 11.2 8 months ago. T&S already done.  I routed results to PCP for follow up purposes. I also left a voicemail with results for Janett Billow in Dr. Clarice Pole office.   EKG 09/11/16: NSR   If no changes, I anticipate pt can proceed with surgery as scheduled.   Willeen Cass, FNP-BC [facility]/Anesthesiology Phone: [phone] 09/12/2016 1:51 PM

## 2016-09-17 ENCOUNTER — Inpatient Hospital Stay (HOSPITAL_COMMUNITY): Payer: BLUE CROSS/BLUE SHIELD | Admitting: Anesthesiology

## 2016-09-17 ENCOUNTER — Encounter (HOSPITAL_COMMUNITY)
Admission: RE | Disposition: A | Discharge status: Home / Self Care | Payer: Self-pay | Source: Ambulatory Visit | Attending: Neurological Surgery

## 2016-09-17 ENCOUNTER — Inpatient Hospital Stay (HOSPITAL_COMMUNITY): Payer: BLUE CROSS/BLUE SHIELD

## 2016-09-17 ENCOUNTER — Inpatient Hospital Stay (HOSPITAL_COMMUNITY): Payer: BLUE CROSS/BLUE SHIELD | Admitting: Vascular Surgery

## 2016-09-17 ENCOUNTER — Inpatient Hospital Stay (HOSPITAL_COMMUNITY)
Admission: RE | Admit: 2016-09-17 | Discharge: 2016-09-18 | DRG: 460 | Disposition: A | Discharge status: Home / Self Care | Payer: BLUE CROSS/BLUE SHIELD | Source: Ambulatory Visit | Attending: Neurological Surgery | Admitting: Neurological Surgery

## 2016-09-17 ENCOUNTER — Encounter (HOSPITAL_COMMUNITY): Payer: Self-pay | Admitting: Anesthesiology

## 2016-09-17 DIAGNOSIS — M4726 Other spondylosis with radiculopathy, lumbar region: Secondary | ICD-10-CM | POA: Diagnosis present

## 2016-09-17 DIAGNOSIS — Z8051 Family history of malignant neoplasm of kidney: Secondary | ICD-10-CM

## 2016-09-17 DIAGNOSIS — Z419 Encounter for procedure for purposes other than remedying health state, unspecified: Secondary | ICD-10-CM

## 2016-09-17 DIAGNOSIS — I10 Essential (primary) hypertension: Secondary | ICD-10-CM | POA: Diagnosis present

## 2016-09-17 DIAGNOSIS — M4316 Spondylolisthesis, lumbar region: Secondary | ICD-10-CM | POA: Diagnosis present

## 2016-09-17 DIAGNOSIS — M48061 Spinal stenosis, lumbar region without neurogenic claudication: Principal | ICD-10-CM | POA: Diagnosis present

## 2016-09-17 DIAGNOSIS — Z87891 Personal history of nicotine dependence: Secondary | ICD-10-CM | POA: Diagnosis not present

## 2016-09-17 DIAGNOSIS — Z8249 Family history of ischemic heart disease and other diseases of the circulatory system: Secondary | ICD-10-CM | POA: Diagnosis not present

## 2016-09-17 SURGERY — POSTERIOR LUMBAR FUSION 1 LEVEL
Anesthesia: General | Site: Back

## 2016-09-17 MED ORDER — SODIUM CHLORIDE 0.9% FLUSH: 3.0000 mL | INTRAVENOUS | Status: DC | PRN

## 2016-09-17 MED ORDER — ONDANSETRON HCL 4 MG/2ML IJ SOLN
INTRAMUSCULAR | Status: DC | PRN
  Administered 2016-09-17 (×2): 4 mg via INTRAVENOUS

## 2016-09-17 MED ORDER — OXYCODONE-ACETAMINOPHEN 5-325 MG PO TABS
1.0000 | ORAL_TABLET | ORAL | Status: DC | PRN
  Administered 2016-09-17 – 2016-09-18 (×4): 2 via ORAL
  Filled 2016-09-17 (×4): qty 2

## 2016-09-17 MED ORDER — BUPIVACAINE HCL (PF) 0.5 % IJ SOLN
INTRAMUSCULAR | Status: AC
  Filled 2016-09-17: qty 30

## 2016-09-17 MED ORDER — MIDAZOLAM HCL 2 MG/2ML IJ SOLN
INTRAMUSCULAR | Status: AC
  Filled 2016-09-17: qty 2

## 2016-09-17 MED ORDER — SODIUM CHLORIDE 0.9 % IV SOLN: 250.0000 mL | INTRAVENOUS | Status: DC

## 2016-09-17 MED ORDER — SENNA 8.6 MG PO TABS
1.0000 | ORAL_TABLET | Freq: Two times a day (BID) | ORAL | Status: DC
  Administered 2016-09-17 (×2): 8.6 mg via ORAL
  Filled 2016-09-17 (×2): qty 1

## 2016-09-17 MED ORDER — FENTANYL CITRATE (PF) 100 MCG/2ML IJ SOLN
INTRAMUSCULAR | Status: AC
  Filled 2016-09-17: qty 4

## 2016-09-17 MED ORDER — METHOCARBAMOL 1000 MG/10ML IJ SOLN
500.0000 mg | Freq: Four times a day (QID) | INTRAVENOUS | Status: DC | PRN
  Filled 2016-09-17: qty 5

## 2016-09-17 MED ORDER — BISACODYL 10 MG RE SUPP: 10.0000 mg | Freq: Every day | RECTAL | Status: DC | PRN

## 2016-09-17 MED ORDER — THROMBIN 20000 UNITS EX SOLR
CUTANEOUS | Status: AC
  Filled 2016-09-17: qty 20000

## 2016-09-17 MED ORDER — LISINOPRIL 20 MG PO TABS
10.0000 mg | ORAL_TABLET | Freq: Every day | ORAL | Status: DC
  Administered 2016-09-17: 10 mg via ORAL
  Filled 2016-09-17: qty 1

## 2016-09-17 MED ORDER — FENTANYL CITRATE (PF) 100 MCG/2ML IJ SOLN
INTRAMUSCULAR | Status: DC | PRN
  Administered 2016-09-17 (×2): 50 ug via INTRAVENOUS
  Administered 2016-09-17: 100 ug via INTRAVENOUS

## 2016-09-17 MED ORDER — HYDROMORPHONE HCL 1 MG/ML IJ SOLN
INTRAMUSCULAR | Status: AC
  Administered 2016-09-17: 0.5 mg via INTRAVENOUS
  Filled 2016-09-17: qty 0.5

## 2016-09-17 MED ORDER — ACETAMINOPHEN 325 MG PO TABS: 650.0000 mg | ORAL_TABLET | ORAL | Status: DC | PRN

## 2016-09-17 MED ORDER — SODIUM CHLORIDE 0.9 % IV SOLN: INTRAVENOUS | Status: DC

## 2016-09-17 MED ORDER — PROPOFOL 10 MG/ML IV BOLUS
INTRAVENOUS | Status: AC
  Filled 2016-09-17: qty 20

## 2016-09-17 MED ORDER — CEFAZOLIN IN D5W 1 GM/50ML IV SOLN
1.0000 g | Freq: Three times a day (TID) | INTRAVENOUS | Status: AC
  Administered 2016-09-17 (×2): 1 g via INTRAVENOUS
  Filled 2016-09-17 (×2): qty 50

## 2016-09-17 MED ORDER — HYDROMORPHONE HCL 1 MG/ML IJ SOLN: 0.5000 mg | INTRAMUSCULAR | Status: DC | PRN

## 2016-09-17 MED ORDER — ACETAMINOPHEN 650 MG RE SUPP: 650.0000 mg | RECTAL | Status: DC | PRN

## 2016-09-17 MED ORDER — CEFAZOLIN SODIUM-DEXTROSE 2-4 GM/100ML-% IV SOLN
INTRAVENOUS | Status: AC
  Filled 2016-09-17: qty 100

## 2016-09-17 MED ORDER — HYDROMORPHONE HCL 1 MG/ML IJ SOLN
INTRAMUSCULAR | Status: AC
  Filled 2016-09-17: qty 0.5

## 2016-09-17 MED ORDER — BUPIVACAINE HCL (PF) 0.5 % IJ SOLN
INTRAMUSCULAR | Status: DC | PRN
  Administered 2016-09-17: 15 mL
  Administered 2016-09-17: 10 mL

## 2016-09-17 MED ORDER — SODIUM CHLORIDE 0.9 % IR SOLN
Status: DC | PRN
  Administered 2016-09-17: 07:00:00

## 2016-09-17 MED ORDER — METHOCARBAMOL 500 MG PO TABS
500.0000 mg | ORAL_TABLET | Freq: Four times a day (QID) | ORAL | Status: DC | PRN
  Administered 2016-09-17 – 2016-09-18 (×2): 500 mg via ORAL
  Filled 2016-09-17 (×2): qty 1

## 2016-09-17 MED ORDER — POLYETHYLENE GLYCOL 3350 17 G PO PACK: 17.0000 g | PACK | Freq: Every day | ORAL | Status: DC | PRN

## 2016-09-17 MED ORDER — LIDOCAINE-EPINEPHRINE (PF) 2 %-1:200000 IJ SOLN
INTRAMUSCULAR | Status: AC
  Filled 2016-09-17: qty 20

## 2016-09-17 MED ORDER — ROCURONIUM BROMIDE 100 MG/10ML IV SOLN
INTRAVENOUS | Status: DC | PRN
  Administered 2016-09-17: 50 mg via INTRAVENOUS

## 2016-09-17 MED ORDER — EPHEDRINE SULFATE 50 MG/ML IJ SOLN
INTRAMUSCULAR | Status: DC | PRN
  Administered 2016-09-17: 10 mg via INTRAVENOUS
  Administered 2016-09-17: 5 mg via INTRAVENOUS
  Administered 2016-09-17: 10 mg via INTRAVENOUS

## 2016-09-17 MED ORDER — LACTATED RINGERS IV SOLN
INTRAVENOUS | Status: DC | PRN
  Administered 2016-09-17 (×2): via INTRAVENOUS

## 2016-09-17 MED ORDER — LIDOCAINE-EPINEPHRINE 1 %-1:100000 IJ SOLN
INTRAMUSCULAR | Status: DC | PRN
  Administered 2016-09-17: 10 mL

## 2016-09-17 MED ORDER — GLYCOPYRROLATE 0.2 MG/ML IJ SOLN
INTRAMUSCULAR | Status: DC | PRN
  Administered 2016-09-17: 0.4 mg via INTRAVENOUS

## 2016-09-17 MED ORDER — THROMBIN 5000 UNITS EX SOLR
OROMUCOSAL | Status: DC | PRN
  Administered 2016-09-17: 07:00:00 via TOPICAL

## 2016-09-17 MED ORDER — OXYCODONE HCL 5 MG/5ML PO SOLN: 5.0000 mg | Freq: Once | ORAL | Status: DC | PRN

## 2016-09-17 MED ORDER — SODIUM CHLORIDE 0.9% FLUSH
3.0000 mL | Freq: Two times a day (BID) | INTRAVENOUS | Status: DC
  Administered 2016-09-17: 3 mL via INTRAVENOUS

## 2016-09-17 MED ORDER — PROPOFOL 10 MG/ML IV BOLUS
INTRAVENOUS | Status: DC | PRN
Start: 2016-09-17 — End: 2016-09-17
  Administered 2016-09-17: 30 mg via INTRAVENOUS
  Administered 2016-09-17: 20 mg via INTRAVENOUS
  Administered 2016-09-17: 100 mg via INTRAVENOUS

## 2016-09-17 MED ORDER — ALUM & MAG HYDROXIDE-SIMETH 200-200-20 MG/5ML PO SUSP: 30.0000 mL | Freq: Four times a day (QID) | ORAL | Status: DC | PRN

## 2016-09-17 MED ORDER — 0.9 % SODIUM CHLORIDE (POUR BTL) OPTIME
TOPICAL | Status: DC | PRN
  Administered 2016-09-17: 1000 mL

## 2016-09-17 MED ORDER — DEXAMETHASONE SODIUM PHOSPHATE 10 MG/ML IJ SOLN
INTRAMUSCULAR | Status: DC | PRN
  Administered 2016-09-17: 10 mg via INTRAVENOUS

## 2016-09-17 MED ORDER — HYDROMORPHONE HCL 1 MG/ML IJ SOLN
0.2500 mg | INTRAMUSCULAR | Status: DC | PRN
  Administered 2016-09-17 (×3): 0.5 mg via INTRAVENOUS

## 2016-09-17 MED ORDER — SURGIFOAM 100 EX MISC
CUTANEOUS | Status: DC | PRN
  Administered 2016-09-17: 07:00:00 via TOPICAL

## 2016-09-17 MED ORDER — DOCUSATE SODIUM 100 MG PO CAPS
100.0000 mg | ORAL_CAPSULE | Freq: Two times a day (BID) | ORAL | Status: DC
Start: 2016-09-17 — End: 2016-09-18
  Administered 2016-09-17 (×2): 100 mg via ORAL
  Filled 2016-09-17 (×2): qty 1

## 2016-09-17 MED ORDER — CHLORHEXIDINE GLUCONATE CLOTH 2 % EX PADS: 6.0000 | MEDICATED_PAD | Freq: Once | CUTANEOUS | Status: DC

## 2016-09-17 MED ORDER — PHENYLEPHRINE HCL 10 MG/ML IJ SOLN
INTRAMUSCULAR | Status: DC | PRN
  Administered 2016-09-17 (×2): 80 ug via INTRAVENOUS

## 2016-09-17 MED ORDER — NEOSTIGMINE METHYLSULFATE 10 MG/10ML IV SOLN
INTRAVENOUS | Status: DC | PRN
  Administered 2016-09-17: 3 mg via INTRAVENOUS

## 2016-09-17 MED ORDER — EST ESTROGENS-METHYLTEST 1.25-2.5 MG PO TABS: 1.0000 | ORAL_TABLET | Freq: Every day | ORAL | Status: DC

## 2016-09-17 MED ORDER — ONDANSETRON HCL 4 MG/2ML IJ SOLN: 4.0000 mg | INTRAMUSCULAR | Status: DC | PRN

## 2016-09-17 MED ORDER — MENTHOL 3 MG MT LOZG: 1.0000 | LOZENGE | OROMUCOSAL | Status: DC | PRN

## 2016-09-17 MED ORDER — HYDROCODONE-ACETAMINOPHEN 5-325 MG PO TABS: 1.0000 | ORAL_TABLET | ORAL | Status: DC | PRN

## 2016-09-17 MED ORDER — MIDAZOLAM HCL 5 MG/5ML IJ SOLN
INTRAMUSCULAR | Status: DC | PRN
  Administered 2016-09-17: 2 mg via INTRAVENOUS

## 2016-09-17 MED ORDER — LIDOCAINE HCL (CARDIAC) 20 MG/ML IV SOLN
INTRAVENOUS | Status: DC | PRN
  Administered 2016-09-17: 50 mg via INTRAVENOUS

## 2016-09-17 MED ORDER — OXYCODONE HCL 5 MG PO TABS: 5.0000 mg | ORAL_TABLET | Freq: Once | ORAL | Status: DC | PRN

## 2016-09-17 MED ORDER — BUSPIRONE HCL 5 MG PO TABS
5.0000 mg | ORAL_TABLET | Freq: Two times a day (BID) | ORAL | Status: DC
  Administered 2016-09-17: 5 mg via ORAL
  Filled 2016-09-17 (×2): qty 1

## 2016-09-17 MED ORDER — PHENOL 1.4 % MT LIQD: 1.0000 | OROMUCOSAL | Status: DC | PRN

## 2016-09-17 MED ORDER — THROMBIN 5000 UNITS EX SOLR
CUTANEOUS | Status: AC
  Filled 2016-09-17: qty 5000

## 2016-09-17 MED ORDER — VITAMIN D 1000 UNITS PO TABS
1000.0000 [IU] | ORAL_TABLET | Freq: Every day | ORAL | Status: DC
  Administered 2016-09-17: 1000 [IU] via ORAL
  Filled 2016-09-17: qty 1

## 2016-09-17 MED ORDER — CEFAZOLIN SODIUM-DEXTROSE 2-4 GM/100ML-% IV SOLN
2.0000 g | INTRAVENOUS | Status: AC
  Administered 2016-09-17: 2 g via INTRAVENOUS

## 2016-09-17 SURGICAL SUPPLY — 67 items
ADH SKN CLS APL DERMABOND .7 (GAUZE/BANDAGES/DRESSINGS) ×1
APL SRG 60D 8 XTD TIP BNDBL (TIP)
BAG DECANTER FOR FLEXI CONT (MISCELLANEOUS) ×2 IMPLANT
BASKET BONE COLLECTION (BASKET) ×3 IMPLANT
BLADE CLIPPER SURG (BLADE) IMPLANT
BONE EQUIVA 10CC (Bone Implant) ×1 IMPLANT
BUR MATCHSTICK NEURO 3.0 LAGG (BURR) ×2 IMPLANT
CANISTER SUCT 3000ML PPV (MISCELLANEOUS) ×2 IMPLANT
CARTRIDGE OIL MAESTRO DRILL (MISCELLANEOUS) ×1 IMPLANT
CONT SPEC 4OZ CLIKSEAL STRL BL (MISCELLANEOUS) ×2 IMPLANT
COVER BACK TABLE 60X90IN (DRAPES) ×2 IMPLANT
DECANTER SPIKE VIAL GLASS SM (MISCELLANEOUS) ×2 IMPLANT
DERMABOND ADVANCED (GAUZE/BANDAGES/DRESSINGS) ×1
DERMABOND ADVANCED .7 DNX12 (GAUZE/BANDAGES/DRESSINGS) ×1 IMPLANT
DEVICE DISSECT PLASMABLAD 3.0S (MISCELLANEOUS) ×1 IMPLANT
DIFFUSER DRILL AIR PNEUMATIC (MISCELLANEOUS) ×1 IMPLANT
DRAPE C-ARM 42X72 X-RAY (DRAPES) ×4 IMPLANT
DRAPE HALF SHEET 40X57 (DRAPES) IMPLANT
DRAPE LAPAROTOMY 100X72X124 (DRAPES) ×2 IMPLANT
DRAPE POUCH INSTRU U-SHP 10X18 (DRAPES) ×2 IMPLANT
DRSG OPSITE POSTOP 4X6 (GAUZE/BANDAGES/DRESSINGS) ×1 IMPLANT
DURAPREP 26ML APPLICATOR (WOUND CARE) ×2 IMPLANT
DURASEAL APPLICATOR TIP (TIP) IMPLANT
DURASEAL SPINE SEALANT 3ML (MISCELLANEOUS) IMPLANT
ELECT REM PT RETURN 9FT ADLT (ELECTROSURGICAL) ×2
ELECTRODE REM PT RTRN 9FT ADLT (ELECTROSURGICAL) ×1 IMPLANT
GAUZE SPONGE 4X4 12PLY STRL (GAUZE/BANDAGES/DRESSINGS) ×2 IMPLANT
GAUZE SPONGE 4X4 16PLY XRAY LF (GAUZE/BANDAGES/DRESSINGS) IMPLANT
GLOVE BIOGEL PI IND STRL 8.5 (GLOVE) ×2 IMPLANT
GLOVE BIOGEL PI INDICATOR 8.5 (GLOVE) ×2
GLOVE ECLIPSE 8.0 STRL XLNG CF (GLOVE) ×1 IMPLANT
GLOVE ECLIPSE 8.5 STRL (GLOVE) ×4 IMPLANT
GLOVE INDICATOR 7.5 STRL GRN (GLOVE) ×2 IMPLANT
GLOVE SURG SS PI 7.0 STRL IVOR (GLOVE) ×3 IMPLANT
GLOVE SURG SS PI 7.5 STRL IVOR (GLOVE) ×2 IMPLANT
GOWN STRL REUS W/ TWL LRG LVL3 (GOWN DISPOSABLE) IMPLANT
GOWN STRL REUS W/ TWL XL LVL3 (GOWN DISPOSABLE) IMPLANT
GOWN STRL REUS W/TWL 2XL LVL3 (GOWN DISPOSABLE) ×4 IMPLANT
GOWN STRL REUS W/TWL LRG LVL3 (GOWN DISPOSABLE) ×2
GOWN STRL REUS W/TWL XL LVL3 (GOWN DISPOSABLE) ×2
HEMOSTAT POWDER KIT SURGIFOAM (HEMOSTASIS) IMPLANT
KIT BASIN OR (CUSTOM PROCEDURE TRAY) ×2 IMPLANT
KIT ROOM TURNOVER OR (KITS) ×2 IMPLANT
NEEDLE HYPO 22GX1.5 SAFETY (NEEDLE) ×2 IMPLANT
NS IRRIG 1000ML POUR BTL (IV SOLUTION) ×2 IMPLANT
OIL CARTRIDGE MAESTRO DRILL (MISCELLANEOUS)
PACK LAMINECTOMY NEURO (CUSTOM PROCEDURE TRAY) ×2 IMPLANT
PAD ARMBOARD 7.5X6 YLW CONV (MISCELLANEOUS) ×6 IMPLANT
PATTIES SURGICAL .5 X1 (DISPOSABLE) ×2 IMPLANT
PLASMABLADE 3.0S (MISCELLANEOUS) ×2
ROD TI ALLOY CVD VIT 40MM (Rod) ×1 IMPLANT
ROD TI ALLOY CVD VIT 5.5X35MM (Rod) ×1 IMPLANT
SCREW VITALITY PA 6.5X45MM (Screw) ×4 IMPLANT
SPACER ZYSTON STR 10X25X10X8 (Spacer) ×2 IMPLANT
SPONGE LAP 4X18 X RAY DECT (DISPOSABLE) IMPLANT
SPONGE SURGIFOAM ABS GEL 100 (HEMOSTASIS) ×2 IMPLANT
SUT PROLENE 6 0 BV (SUTURE) ×1 IMPLANT
SUT VIC AB 1 CT1 18XBRD ANBCTR (SUTURE) ×1 IMPLANT
SUT VIC AB 1 CT1 8-18 (SUTURE) ×2
SUT VIC AB 2-0 CP2 18 (SUTURE) ×2 IMPLANT
SUT VIC AB 3-0 SH 8-18 (SUTURE) ×2 IMPLANT
SYR 3ML LL SCALE MARK (SYRINGE) ×7 IMPLANT
TOP CLOSURE TORQ LIMIT (Neuro Prosthesis/Implant) ×4 IMPLANT
TOWEL OR 17X24 6PK STRL BLUE (TOWEL DISPOSABLE) ×2 IMPLANT
TOWEL OR 17X26 10 PK STRL BLUE (TOWEL DISPOSABLE) ×2 IMPLANT
TRAY FOLEY W/METER SILVER 16FR (SET/KITS/TRAYS/PACK) ×2 IMPLANT
WATER STERILE IRR 1000ML POUR (IV SOLUTION) ×2 IMPLANT

## 2016-09-17 NOTE — Op Note (Signed)
Procedure: L4-L5 decompressive laminectomy decompression of L4 and L5 nerve roots, posterior lumbar interbody arthrodesis with peek spacers local autograft and allograft, pedicle screw fixation L4-L5, posterior lateral arthrodesis L4-L5  Surgeon: Kristeen Miss M.D.  Asst.: NG min ditty M.D.  Indications: Patient is [name] is a 55 y.o. female who who's had significant back pain and lumbar radiculopathy for over a years period time. A lumbar myelogram demonstrates advanced spondylolisthesis with high-grade canal stenosis. she was advised regarding surgical intervention.  Procedure: The patient was brought to the operating room supine on a stretcher. After the smooth induction of general endotracheal anesthesia she was turned prone and the back was prepped with alcohol and DuraPrep. The back was then draped sterilely. A midline incision was created and carried down to the lumbar dorsal fascia. A localizing radiograph identified the L4 and L5 spinous processes. A subligamentous dissection was created at L4 and L5 to expose the interlaminar space at L4 and L5 and the facet joints over the L4-L5 interspace. Laminotomies were were then created removing the entire inferior margin of the lamina of L4 including the inferior facet at the L4-L5 joint. The yellow ligament was taken up and the common dural tube was exposed along with the L4 nerve root superiorly, and the L5 nerve root inferiorly, the disc space was exposed and epidural veins in this region were cauterized and divided. The L4 nerve roots and the L5 nerve root were dissected with care taken to protect them. The disc space was opened and a combination of curettes and rongeurs was used to evacuate the disc space fully. The endplates were removed using sharp curettes. An interbody spacer was placed to distract the disc space while the contralateral discectomy was performed. When the entirety of the disc was removed and the endplates were prepared  final sizing of the disc space was obtained 10 mm peek spacers with 8 lordosis were chosen and packed with autograft and allograft and placed into the interspace. The remainder of the interspace was packed with autograft and allograft for a total volume of 9 mL.Marland Kitchen Pedicle entry sites were then chosen using fluoroscopic guidance and 6.5 x 45 screws were placed in L4 and 6.5 x 45 mm screws were placed in L5. The lateral gutters were decorticated and graft was packed in the posterolateral gutters between L4 and L5. Final radiographs were obtained after placing appropriately sized rods between the pedicle screws at L4-L5 and torquing these to the appropriate tension. The surgical site was inspected carefully to assure the L4 and L5 nerve roots were well decompressed, hemostasis was obtained, and the graft was well packed. Then the retractors were removed and the wound was closed with #1 Vicryl in the lumbar dorsal fascia 2-0 Vicryl in the subcutaneous tissue and 3-0 Vicryl subcuticularly. When he cc of half percent Marcaine was injected into the paraspinous musculature at the time of closure. Blood loss was estimated at 150 cc. The patient tolerated procedure well and was returned to the recovery room in stable condition.

## 2016-09-17 NOTE — Transfer of Care (Signed)
Immediate Anesthesia Transfer of Care Note  Patient: [name]  Procedure(s) Performed: Procedure(s): LUMBAR FOUR-FIVE POSTERIOR LUMBAR INTERBODY FUSION (N/A)  Patient Location: PACU  Anesthesia Type:General  Level of Consciousness: awake, alert , oriented and patient cooperative  Airway & Oxygen Therapy: Patient Spontanous Breathing and Patient connected to nasal cannula oxygen  Post-op Assessment: Report given to RN and Post -op Vital signs reviewed and stable  Post vital signs: Reviewed and stable  Last Vitals:  Vitals:   09/17/16 0628  BP: (!) 125/46  Pulse: (!) 56  Resp: 16  Temp: 36.9 C    Last Pain:  Vitals:   09/17/16 0628  TempSrc: Oral  PainSc:       Patients Stated Pain Goal: 8 (09/17/16 [id number])  Complications: No apparent anesthesia complications

## 2016-09-17 NOTE — H&P (Signed)
[name] is an 55 y.o. female.   Chief Complaint: Back and bilateral leg pain HPI: [name] a 55 year old individual whom I been following for several years now. She presented with back pain with radiation into both buttocks. She has evidence of a spondylolisthesis at L4-L5. Should initially responded to conservative efforts including epidural steroid injections but over the past year these of become increasingly refractory. Recent myelogram demonstrates that she has significant stenosis in the lateral recesses at L4-L5 and she's been advised regarding the need for surgical decompression and stabilization posterior interbody technique. She is now admitted for this procedure.  Past Medical History:  Diagnosis Date  . Anxiety   . Back pain of lumbosacaral region with sciatica 01/03/2016  . Complication of anesthesia   . Current use of estrogen therapy 12/02/2014  . DJD (degenerative joint disease), lumbosacral 01/11/2016  . Dysuria 01/19/2014  . Fibrocystic breast changes 01/11/2016  . GERD (gastroesophageal reflux disease)    occ  . H/O estrogen therapy   . Hypertension     Past Surgical History:  Procedure Laterality Date  . APPENDECTOMY  2007   had with bso  . BILATERAL SALPINGOOPHORECTOMY  2007  . CARPAL TUNNEL RELEASE Right    has had 3x  . CESAREAN SECTION    . COLONOSCOPY N/A 01/13/2014   Procedure: COLONOSCOPY;  Surgeon: [name], MD;  Location: [facility];  Service: Endoscopy;  Laterality: N/A;  930  . NECK SURGERY  12/2010  . TOTAL ABDOMINAL HYSTERECTOMY  07/2005    for menorrhagia and ut. fibroids    Family History  Problem Relation Age of Onset  . Parkinsonism Father   . Alzheimer's disease Father   . Heart attack Brother   . Diabetes Sister     borderline  . Cancer Sister     kidney  . Hypertension Sister   . COPD Mother   . Hypertension Brother   . Colon cancer Neg Hx    Social History:  reports that she quit smoking about 20 years ago. Her  smoking use included Cigarettes. She has a 1.25 pack-year smoking history. She has never used smokeless tobacco. She reports that she does not drink alcohol or use drugs.  Allergies:  Allergies  Allergen Reactions  . No Known Allergies     Medications Prior to Admission  Medication Sig Dispense Refill  . busPIRone (BUSPAR) 5 MG tablet TAKE 1 TABLET (5 MG TOTAL) BY MOUTH 2 (TWO) TIMES DAILY. 180 tablet 4  . cholecalciferol (VITAMIN D) 1000 UNITS tablet Take 1,000 Units by mouth daily.     Marland Kitchen estrogens-methylTEST (ESTRATEST) 1.25-2.5 MG tablet TAKE 1 TABLET EVERY DAY 90 tablet 1  . lisinopril (PRINIVIL,ZESTRIL) 10 MG tablet TAKE 1 TABLET (10 MG TOTAL) BY MOUTH DAILY. 90 tablet 3  . methocarbamol (ROBAXIN) 500 MG tablet Take 500 mg by mouth daily as needed for muscle spasms.     . naproxen sodium (ANAPROX) 220 MG tablet Take 440 mg by mouth daily as needed (for pain.).     Marland Kitchen simvastatin (ZOCOR) 10 MG tablet Take 1 daily at HS (Patient not taking: Reported on 09/06/2016) 90 tablet 3    No results found for this or any previous visit (from the past 48 hour(s)). No results found.  Review of Systems  HENT: Negative.   Eyes: Negative.   Respiratory: Negative.   Cardiovascular: Negative.   Gastrointestinal: Negative.   Genitourinary: Negative.   Musculoskeletal: Positive for back pain.  Skin:  Negative.   Neurological: Positive for tingling, sensory change and weakness.  Psychiatric/Behavioral: Negative.     Blood pressure (!) 125/46, pulse (!) 56, temperature 98.4 F (36.9 C), temperature source Oral, resp. rate 16, weight 52.4 kg (115 lb 9.6 oz), SpO2 100 %. Physical Exam  Constitutional: She is oriented to person, place, and time. She appears well-developed and well-nourished.  HENT:  Head: Normocephalic and atraumatic.  Eyes: Conjunctivae and EOM are normal. Pupils are equal, round, and reactive to light.  Neck: Neck supple.  Cardiovascular: Normal rate and regular rhythm.    Respiratory: Effort normal and breath sounds normal.  GI: Soft. Bowel sounds are normal.  Musculoskeletal:  Positive straight leg raising bilaterally at 30. Patrick's maneuver is negative bilaterally.  Neurological: She is alert and oriented to person, place, and time.  Absent deep tendon reflexes in both Achilles. Patellar reflexes are 2+. Motor strength appears intact in lower extremities including iliopsoas quadriceps tibialis anterior and gastrocs. Don't heal walking are intact. Station and gait are intact.  Skin: Skin is warm and dry.  Psychiatric: She has a normal mood and affect. Her behavior is normal. Judgment and thought content normal.     Assessment/Plan Spondylolisthesis L4-L5 with stenosis, lumbar radiculopathy. Posterior lumbar decompression with interbody arthrodesis peek spacers pedicle screw fixation L4-L5.  Earleen Newport, MD 09/17/2016, 7:15 AM

## 2016-09-17 NOTE — Progress Notes (Signed)
Orthopedic Tech Progress Note Patient Details:  Oliva BustardSabrina B Urick 01/20/1961 [id number] Patient has brace Patient ID: Oliva BustardSabrina B Kennan, female   DOB: 06/29/1961, 55 y.o.   MRN: [id number]   Jennye MoccasinHughes, Abigayl Hor Craig 09/17/2016, 3:03 PM

## 2016-09-17 NOTE — Anesthesia Preprocedure Evaluation (Signed)
Anesthesia Evaluation  Patient identified by MRN, date of birth, ID band Patient awake    Reviewed: Allergy & Precautions, NPO status , Patient's Chart, lab work & pertinent test results  History of Anesthesia Complications Negative for: history of anesthetic complications  Airway Mallampati: I  TM Distance: >3 FB Neck ROM: Full    Dental  (+) Teeth Intact   Pulmonary neg shortness of breath, neg COPD, former smoker,    breath sounds clear to auscultation       Cardiovascular hypertension, Pt. on medications (-) angina(-) Past MI, (-) CHF and (-) DOE (-) dysrhythmias  Rhythm:Regular     Neuro/Psych PSYCHIATRIC DISORDERS Anxiety negative neurological ROS     GI/Hepatic Neg liver ROS, GERD  Controlled,  Endo/Other  negative endocrine ROS  Renal/GU negative Renal ROS     Musculoskeletal  (+) Arthritis ,   Abdominal   Peds  Hematology  (+) anemia ,   Anesthesia Other Findings   Reproductive/Obstetrics                             Anesthesia Physical Anesthesia Plan  ASA: II  Anesthesia Plan: General   Post-op Pain Management:    Induction: Intravenous  Airway Management Planned: Oral ETT  Additional Equipment: None  Intra-op Plan:   Post-operative Plan: Extubation in OR  Informed Consent: I have reviewed the patients History and Physical, chart, labs and discussed the procedure including the risks, benefits and alternatives for the proposed anesthesia with the patient or authorized representative who has indicated his/her understanding and acceptance.   Dental advisory given  Plan Discussed with: CRNA and Surgeon  Anesthesia Plan Comments:         Anesthesia Quick Evaluation

## 2016-09-18 MED ORDER — OXYCODONE-ACETAMINOPHEN 5-325 MG PO TABS: 1.0000 | ORAL_TABLET | ORAL | 0 refills | Status: DC | PRN

## 2016-09-18 MED ORDER — DIAZEPAM 5 MG PO TABS: 5.0000 mg | ORAL_TABLET | Freq: Four times a day (QID) | ORAL | 0 refills | Status: DC | PRN

## 2016-09-18 NOTE — Progress Notes (Signed)
Pt and husband given D/C instructions with Rx's, verbal understanding was provided. Pt's incision is clean and dry with no sign of infection. Honeycomb dressing was removed prior to D/C per MD order. Pt's IV was removed prior to D/C. Pt D/C'd home via wheelchair @ 1015 per MD order. Pt is stable @ D/C and has no other needs at this time. Rema FendtAshley Gaetan Spieker, RN

## 2016-09-18 NOTE — Anesthesia Postprocedure Evaluation (Signed)
Anesthesia Post Note  Patient: Oliva BustardSabrina B Weller  Procedure(s) Performed: Procedure(s) (LRB): LUMBAR FOUR-FIVE POSTERIOR LUMBAR INTERBODY FUSION (N/A)  Patient location during evaluation: PACU Anesthesia Type: General Level of consciousness: awake and alert Pain management: pain level controlled Vital Signs Assessment: post-procedure vital signs reviewed and stable Respiratory status: spontaneous breathing, nonlabored ventilation, respiratory function stable and patient connected to nasal cannula oxygen Cardiovascular status: stable Postop Assessment: no signs of nausea or vomiting Anesthetic complications: no       Last Vitals:  Vitals:   09/18/16 0445 09/18/16 0805  BP: (!) 91/46 (!) 114/47  Pulse: 80 62  Resp: 18 18  Temp: 36.8 C 36.8 C    Last Pain:  Vitals:   09/18/16 0900  TempSrc:   PainSc: 8                  Elmo Rio

## 2016-09-18 NOTE — Discharge Instructions (Signed)

## 2016-09-18 NOTE — Evaluation (Signed)
Physical Therapy Evaluation and Discharge Patient Details Name: Desiree BustardSabrina B Walsh MRN: [id number] DOB: 04/01/1961 Today's Date: 09/18/2016   History of Present Illness  Pt is a 55 yo female with Spondylolisthesis at L4-L5 with radiculopathy and stenosisPatient underwent surgical decompression and stabilization at L4-L5 on 12/18.  Clinical Impression  Patient is s/p above surgery resulting in the deficits listed below (see PT Problem List). Pt functioning at supervision level, reports 10/10 back pain but tolerating mobility well. Patient with no further acute skilled PT needs at this time. PT SIGNING OFF. Please re-consult if needed in future.     Follow Up Recommendations No PT follow up;Supervision - Intermittent    Equipment Recommendations  None recommended by PT    Recommendations for Other Services       Precautions / Restrictions Precautions Precautions: Back;Fall Precaution Booklet Issued: Yes (comment) Precaution Comments: Pt able to recall 3/3 precautions, good understanding Required Braces or Orthoses: Spinal Brace Spinal Brace: Lumbar corset;Applied in sitting position Restrictions Weight Bearing Restrictions: No      Mobility  Bed Mobility Overal bed mobility: Modified Independent             General bed mobility comments: Pt OOB in recliner when OT arrived  Transfers Overall transfer level: Modified independent Equipment used: None             General transfer comment: pt steady, slow and guarded  Ambulation/Gait Ambulation/Gait assistance: Supervision Ambulation Distance (Feet): 200 Feet Assistive device: None Gait Pattern/deviations: Step-through pattern Gait velocity: slow Gait velocity interpretation: Below normal speed for age/gender General Gait Details: pt with slight trunk flexion, pt with no instabilty  Stairs Stairs: Yes Stairs assistance: Supervision Stair Management: One rail Left;Alternating pattern Number of Stairs: 3     Wheelchair Mobility    Modified Rankin (Stroke Patients Only)       Balance Overall balance assessment: Modified Independent;No apparent balance deficits (not formally assessed)                                           Pertinent Vitals/Pain Pain Assessment: 0-10 Pain Score: 10-Worst pain ever Pain Location: back Pain Descriptors / Indicators: Operative site guarding Pain Intervention(s): Monitored during session;RN gave pain meds during session    Home Living Family/patient expects to be discharged to:: Private residence Living Arrangements: Spouse/significant other Available Help at Discharge: Family;Available 24 hours/day Type of Home: House Home Access: Stairs to enter Entrance Stairs-Rails: Can reach both Entrance Stairs-Number of Steps: 1 Home Layout: One level Home Equipment: Walker - 2 wheels;Shower seat;Grab bars - toilet;Grab bars - tub/shower;Hand held shower head;Adaptive equipment      Prior Function Level of Independence: Independent               Hand Dominance   Dominant Hand: Right    Extremity/Trunk Assessment   Upper Extremity Assessment Upper Extremity Assessment: Overall WFL for tasks assessed    Lower Extremity Assessment Lower Extremity Assessment: Overall WFL for tasks assessed    Cervical / Trunk Assessment Cervical / Trunk Assessment: Other exceptions Cervical / Trunk Exceptions: back sugery  Communication   Communication: No difficulties  Cognition Arousal/Alertness: Awake/alert Behavior During Therapy: WFL for tasks assessed/performed Overall Cognitive Status: Within Functional Limits for tasks assessed                      General Comments  General comments (skin integrity, edema, etc.): incision intact    Exercises     Assessment/Plan    PT Assessment Patent does not need any further PT services  PT Problem List            PT Treatment Interventions      PT Goals (Current goals  can be found in the Care Plan section)  Acute Rehab PT Goals Patient Stated Goal: to get home PT Goal Formulation: All assessment and education complete, DC therapy    Frequency     Barriers to discharge        Co-evaluation               End of Session Equipment Utilized During Treatment: Back brace Activity Tolerance: Patient tolerated treatment well Patient left: in chair;with call bell/phone within reach;with nursing/sitter in room Nurse Communication: Mobility status;Patient requests pain meds         Time: 0745-0806 PT Time Calculation (min) (ACUTE ONLY): 21 min   Charges:   PT Evaluation $PT Eval Low Complexity: 1 Procedure     PT G Codes:        Blanton Kardell M Markeese Boyajian 09/18/2016, 8:53 AM  Lewis ShockAshly Elnore Cosens, PT, DPT Pager #: [phone] Office #: [phone]

## 2016-09-18 NOTE — Evaluation (Signed)
Occupational Therapy Evaluation and Discharge Patient Details Name: Desiree BustardSabrina B Roberg MRN: [id number] DOB: 10/25/1960 Today's Date: 09/18/2016    History of Present Illness Pt is a 55 yo female with Spondylolisthesis at L4-L5 with radiculopathy and stenosisPatient underwent surgical decompression and stabilization at L4-L5 on 12/18.   Clinical Impression   PTA Pt independent with ADL/IADL and mobility. Pt modified independent for ADL/ambulation this morning. Please see performance level below. Pt received all education and safety information, and had no further questions or concerns. OT to sign off at this time. Thank you for this referral.     Follow Up Recommendations  No OT follow up;Supervision - Intermittent    Equipment Recommendations  None recommended by OT;Other (comment) (OT recommended a long handle sponge)    Recommendations for Other Services       Precautions / Restrictions Precautions Precautions: Back;Fall Precaution Booklet Issued: Yes (comment) Precaution Comments: Pt able to recall 3/3 precautions, good understanding Required Braces or Orthoses: Spinal Brace Spinal Brace: Lumbar corset;Applied in sitting position Restrictions Weight Bearing Restrictions: No      Mobility Bed Mobility Overal bed mobility: Modified Independent             General bed mobility comments: Pt OOB in recliner when OT arrived  Transfers Overall transfer level: Modified independent Equipment used: None             General transfer comment: pt steady, slow and guarded    Balance Overall balance assessment: Modified Independent;No apparent balance deficits (not formally assessed)                                          ADL Overall ADL's : Modified independent                                       General ADL Comments: Pt educated on precautions during ADL, emphasizing bathroom and kitchen. Pt able to perform sink level ADL  (cup method for oral care), educated on peri care and stand turn to flush instead of twisting, Pt able to get dressed modified independent maintaining precautions, and doff/don brace without assistance.      Vision Vision Assessment?: No apparent visual deficits   Perception     Praxis      Pertinent Vitals/Pain Pain Assessment: 0-10 Pain Score: 10-Worst pain ever Pain Location: back Pain Descriptors / Indicators: Operative site guarding Pain Intervention(s): Monitored during session;RN gave pain meds during session     Hand Dominance Right   Extremity/Trunk Assessment Upper Extremity Assessment Upper Extremity Assessment: Overall WFL for tasks assessed   Lower Extremity Assessment Lower Extremity Assessment: Overall WFL for tasks assessed   Cervical / Trunk Assessment Cervical / Trunk Assessment: Other exceptions Cervical / Trunk Exceptions: back sugery   Communication Communication Communication: No difficulties   Cognition Arousal/Alertness: Awake/alert Behavior During Therapy: WFL for tasks assessed/performed Overall Cognitive Status: Within Functional Limits for tasks assessed                     General Comments       Exercises       Shoulder Instructions      Home Living Family/patient expects to be discharged to:: Private residence Living Arrangements: Spouse/significant other Available Help at Discharge: Family;Available 24 hours/day  Type of Home: House Home Access: Stairs to enter Entergy CorporationEntrance Stairs-Number of Steps: 1 Entrance Stairs-Rails: Can reach both Home Layout: One level     Bathroom Shower/Tub: Tub/shower unit Shower/tub characteristics: Curtain FirefighterBathroom Toilet: Handicapped height Bathroom Accessibility: Yes   Home Equipment: Environmental consultantWalker - 2 wheels;Shower seat;Grab bars - toilet;Grab bars - tub/shower;Hand held shower head;Adaptive equipment Adaptive Equipment: Reacher        Prior Functioning/Environment Level of Independence:  Independent                 OT Problem List: Decreased range of motion;Decreased activity tolerance;Decreased safety awareness;Decreased knowledge of precautions;Pain   OT Treatment/Interventions:      OT Goals(Current goals can be found in the care plan section) Acute Rehab OT Goals Patient Stated Goal: to get home OT Goal Formulation: With patient Time For Goal Achievement: 09/25/16 Potential to Achieve Goals: Good  OT Frequency:     Barriers to D/C:            Co-evaluation              End of Session Equipment Utilized During Treatment: Back brace Nurse Communication: Mobility status;Other (comment) (no need for equipment)  Activity Tolerance: Patient tolerated treatment well Patient left: in chair;with call bell/phone within reach   Time: 0806-0825 OT Time Calculation (min): 19 min Charges:  OT General Charges $OT Visit: 1 Procedure OT Evaluation $OT Eval Moderate Complexity: 1 Procedure G-Codes:    Evern BioLaura J Roselyn Doby 09/18/2016, 8:39 AM Sherryl MangesLaura Mycheal Veldhuizen OTR/L [phone]

## 2016-09-18 NOTE — Discharge Summary (Signed)
Physician Discharge Summary  Patient ID: [name] MRN: [id number] DOB/AGE: 1961/08/04 55 y.o.  Admit date: 09/17/2016 Discharge date: 09/18/2016  Admission Diagnoses:Spondylolisthesis L4-L5 with radiculopathy and stenosis  Discharge Diagnoses: Spondylolisthesis L4-L5 with radiculopathy and stenosis  Active Problems:   Spondylolisthesis at L4-L5 level   Discharged Condition: good  Hospital Course: Patient underwent surgical decompression and stabilization at L4-L5 which she tolerated well.  Consults: None  Significant Diagnostic Studies: None  Treatments: surgery: Decompression L4-L5 with posterior lumbar interbody arthrodesis.  Discharge Exam: Blood pressure (!) 91/46, pulse 80, temperature 98.3 F (36.8 C), temperature source Oral, resp. rate 18, weight 52.4 kg (115 lb 9.6 oz), SpO2 96 %. Incision is clean and dry and station and gait are intact.  Disposition: 01-Home or Self Care  Discharge Instructions    Call MD for:  redness, tenderness, or signs of infection (pain, swelling, redness, odor or green/yellow discharge around incision site)    Complete by:  As directed    Call MD for:  severe uncontrolled pain    Complete by:  As directed    Call MD for:  temperature >100.4    Complete by:  As directed    Diet - low sodium heart healthy    Complete by:  As directed    Discharge instructions    Complete by:  As directed    Okay to shower. Do not apply salves or appointments to incision. No heavy lifting with the upper extremities greater than 15 pounds. May resume driving when not requiring pain medication and patient feels comfortable with doing so.   Increase activity slowly    Complete by:  As directed      Allergies as of 09/18/2016      Reactions   No Known Allergies       Medication List    TAKE these medications   busPIRone 5 MG tablet Commonly known as:  BUSPAR TAKE 1 TABLET (5 MG TOTAL) BY MOUTH 2 (TWO) TIMES DAILY.   cholecalciferol 1000  units tablet Commonly known as:  VITAMIN D Take 1,000 Units by mouth daily.   diazepam 5 MG tablet Commonly known as:  VALIUM Take 1 tablet (5 mg total) by mouth every 6 (six) hours as needed for muscle spasms.   estrogens-methylTEST 1.25-2.5 MG tablet Commonly known as:  ESTRATEST TAKE 1 TABLET EVERY DAY   lisinopril 10 MG tablet Commonly known as:  PRINIVIL,ZESTRIL TAKE 1 TABLET (10 MG TOTAL) BY MOUTH DAILY.   methocarbamol 500 MG tablet Commonly known as:  ROBAXIN Take 500 mg by mouth daily as needed for muscle spasms.   naproxen sodium 220 MG tablet Commonly known as:  ANAPROX Take 440 mg by mouth daily as needed (for pain.).   oxyCODONE-acetaminophen 5-325 MG tablet Commonly known as:  PERCOCET/ROXICET Take 1-2 tablets by mouth every 4 (four) hours as needed for moderate pain.   simvastatin 10 MG tablet Commonly known as:  ZOCOR Take 1 daily at HS        Signed: Marica Trentham J 09/18/2016, 7:36 AM

## 2016-09-18 NOTE — Progress Notes (Signed)
Patient's BP low in SBP 90"s and DBP 40's.Denies lightheadedness/dizziness, nausea.Still c/o back pain 10/10. Dr Danielle DessElsner aware of the soft BP, okayed to give pain med and continue to run IVF for now. Will cont to monitor closely.   09/17/16 2000  Vitals  Temp 98.3 F (36.8 C)  Temp Source Oral  BP (!) 94/46  BP Location Right Arm  BP Method Automatic  Patient Position (if appropriate) Lying  Pulse Rate 63  Pulse Rate Source Monitor  Resp 18

## 2016-09-20 MED FILL — Sodium Chloride IV Soln 0.9%: INTRAVENOUS | Qty: 1000 | Status: AC

## 2016-09-20 MED FILL — Heparin Sodium (Porcine) Inj 1000 Unit/ML: INTRAMUSCULAR | Qty: 30 | Status: AC

## 2016-10-07 NOTE — Addendum Note (Signed)
Encounter addended by: Garlan Fillersessa D Aydon Swamy on: 10/07/2016 12:07 PM<BR>    Actions taken: Imaging Exam ended

## 2016-12-18 ENCOUNTER — Other Ambulatory Visit: Payer: Self-pay | Admitting: Adult Health

## 2016-12-19 ENCOUNTER — Encounter (HOSPITAL_COMMUNITY): Payer: Self-pay | Admitting: *Deleted

## 2016-12-19 ENCOUNTER — Emergency Department (HOSPITAL_COMMUNITY): Payer: BLUE CROSS/BLUE SHIELD

## 2016-12-19 ENCOUNTER — Inpatient Hospital Stay (HOSPITAL_COMMUNITY)
Admission: EM | Admit: 2016-12-19 | Discharge: 2016-12-20 | DRG: 194 | Disposition: A | Discharge status: Home / Self Care | Payer: BLUE CROSS/BLUE SHIELD | Attending: Internal Medicine | Admitting: Internal Medicine

## 2016-12-19 DIAGNOSIS — Z9223 Personal history of estrogen therapy: Secondary | ICD-10-CM | POA: Diagnosis not present

## 2016-12-19 DIAGNOSIS — N179 Acute kidney failure, unspecified: Secondary | ICD-10-CM | POA: Diagnosis present

## 2016-12-19 DIAGNOSIS — F419 Anxiety disorder, unspecified: Secondary | ICD-10-CM | POA: Diagnosis present

## 2016-12-19 DIAGNOSIS — E869 Volume depletion, unspecified: Secondary | ICD-10-CM | POA: Diagnosis present

## 2016-12-19 DIAGNOSIS — Z79890 Hormone replacement therapy (postmenopausal): Secondary | ICD-10-CM | POA: Diagnosis not present

## 2016-12-19 DIAGNOSIS — E871 Hypo-osmolality and hyponatremia: Secondary | ICD-10-CM | POA: Diagnosis present

## 2016-12-19 DIAGNOSIS — M4316 Spondylolisthesis, lumbar region: Secondary | ICD-10-CM | POA: Diagnosis present

## 2016-12-19 DIAGNOSIS — Y95 Nosocomial condition: Secondary | ICD-10-CM | POA: Diagnosis present

## 2016-12-19 DIAGNOSIS — J101 Influenza due to other identified influenza virus with other respiratory manifestations: Secondary | ICD-10-CM

## 2016-12-19 DIAGNOSIS — E860 Dehydration: Secondary | ICD-10-CM | POA: Diagnosis present

## 2016-12-19 DIAGNOSIS — I10 Essential (primary) hypertension: Secondary | ICD-10-CM | POA: Diagnosis present

## 2016-12-19 DIAGNOSIS — J189 Pneumonia, unspecified organism: Secondary | ICD-10-CM | POA: Diagnosis not present

## 2016-12-19 DIAGNOSIS — K219 Gastro-esophageal reflux disease without esophagitis: Secondary | ICD-10-CM | POA: Diagnosis present

## 2016-12-19 DIAGNOSIS — Z79899 Other long term (current) drug therapy: Secondary | ICD-10-CM

## 2016-12-19 DIAGNOSIS — I959 Hypotension, unspecified: Secondary | ICD-10-CM | POA: Diagnosis present

## 2016-12-19 DIAGNOSIS — Z87891 Personal history of nicotine dependence: Secondary | ICD-10-CM

## 2016-12-19 DIAGNOSIS — J1000 Influenza due to other identified influenza virus with unspecified type of pneumonia: Principal | ICD-10-CM | POA: Diagnosis present

## 2016-12-19 LAB — CBC WITH DIFFERENTIAL/PLATELET
BASOS ABS: 0 10*3/uL (ref 0.0–0.1)
Basophils Relative: 0 %
EOS PCT: 0 %
Eosinophils Absolute: 0 10*3/uL (ref 0.0–0.7)
HEMATOCRIT: 26.3 % — AB (ref 36.0–46.0)
Hemoglobin: 8.6 g/dL — ABNORMAL LOW (ref 12.0–15.0)
Lymphocytes Relative: 11 %
Lymphs Abs: 0.9 10*3/uL (ref 0.7–4.0)
MCH: 24 pg — AB (ref 26.0–34.0)
MCHC: 32.7 g/dL (ref 30.0–36.0)
MCV: 73.3 fL — ABNORMAL LOW (ref 78.0–100.0)
MONO ABS: 0.4 10*3/uL (ref 0.1–1.0)
MONOS PCT: 4 %
Neutro Abs: 6.8 10*3/uL (ref 1.7–7.7)
Neutrophils Relative %: 84 %
Platelets: 198 10*3/uL (ref 150–400)
RBC: 3.59 MIL/uL — ABNORMAL LOW (ref 3.87–5.11)
RDW: 17.7 % — AB (ref 11.5–15.5)
WBC: 8.1 10*3/uL (ref 4.0–10.5)

## 2016-12-19 LAB — COMPREHENSIVE METABOLIC PANEL
ALT: 30 U/L (ref 14–54)
AST: 37 U/L (ref 15–41)
Albumin: 2.8 g/dL — ABNORMAL LOW (ref 3.5–5.0)
Alkaline Phosphatase: 46 U/L (ref 38–126)
Anion gap: 9 (ref 5–15)
BUN: 19 mg/dL (ref 6–20)
CALCIUM: 8 mg/dL — AB (ref 8.9–10.3)
CHLORIDE: 96 mmol/L — AB (ref 101–111)
CO2: 22 mmol/L (ref 22–32)
Creatinine, Ser: 1.47 mg/dL — ABNORMAL HIGH (ref 0.44–1.00)
GFR calc non Af Amer: 39 mL/min — ABNORMAL LOW (ref >60)
GFR, EST AFRICAN AMERICAN: 45 mL/min — AB (ref >60)
GLUCOSE: 87 mg/dL (ref 65–99)
Potassium: 4.6 mmol/L (ref 3.5–5.1)
SODIUM: 127 mmol/L — AB (ref 135–145)
Total Bilirubin: 0.5 mg/dL (ref 0.3–1.2)
Total Protein: 6 g/dL — ABNORMAL LOW (ref 6.5–8.1)

## 2016-12-19 LAB — RETICULOCYTES
RBC.: 3.63 MIL/uL — AB (ref 3.87–5.11)
RETIC COUNT ABSOLUTE: 29 10*3/uL (ref 19.0–186.0)
Retic Ct Pct: 0.8 % (ref 0.4–3.1)

## 2016-12-19 LAB — TSH: TSH: 0.661 u[IU]/mL (ref 0.350–4.500)

## 2016-12-19 LAB — TROPONIN I

## 2016-12-19 LAB — INFLUENZA PANEL BY PCR (TYPE A & B)
Influenza A By PCR: NEGATIVE
Influenza B By PCR: POSITIVE — AB

## 2016-12-19 LAB — I-STAT CG4 LACTIC ACID, ED: Lactic Acid, Venous: 1.88 mmol/L (ref 0.5–1.9)

## 2016-12-19 MED ORDER — KETOROLAC TROMETHAMINE 30 MG/ML IJ SOLN: 30.0000 mg | Freq: Four times a day (QID) | INTRAMUSCULAR | Status: DC | PRN

## 2016-12-19 MED ORDER — ACETAMINOPHEN 650 MG RE SUPP: 650.0000 mg | Freq: Four times a day (QID) | RECTAL | Status: DC | PRN

## 2016-12-19 MED ORDER — DIAZEPAM 5 MG PO TABS: 5.0000 mg | ORAL_TABLET | Freq: Four times a day (QID) | ORAL | Status: DC | PRN

## 2016-12-19 MED ORDER — SODIUM CHLORIDE 0.9 % IV BOLUS (SEPSIS)
250.0000 mL | Freq: Once | INTRAVENOUS | Status: AC
  Administered 2016-12-19: 250 mL via INTRAVENOUS

## 2016-12-19 MED ORDER — BUSPIRONE HCL 5 MG PO TABS
5.0000 mg | ORAL_TABLET | Freq: Two times a day (BID) | ORAL | Status: DC
  Administered 2016-12-19 – 2016-12-20 (×2): 5 mg via ORAL
  Filled 2016-12-19 (×2): qty 1

## 2016-12-19 MED ORDER — ACETAMINOPHEN 325 MG PO TABS
650.0000 mg | ORAL_TABLET | Freq: Four times a day (QID) | ORAL | Status: DC | PRN
  Administered 2016-12-20: 650 mg via ORAL
  Filled 2016-12-19: qty 2

## 2016-12-19 MED ORDER — DEXTROSE 5 % IV SOLN
2.0000 g | Freq: Once | INTRAVENOUS | Status: AC
  Administered 2016-12-19: 2 g via INTRAVENOUS
  Filled 2016-12-19: qty 2

## 2016-12-19 MED ORDER — OSELTAMIVIR PHOSPHATE 75 MG PO CAPS: 75.0000 mg | ORAL_CAPSULE | Freq: Two times a day (BID) | ORAL | Status: DC

## 2016-12-19 MED ORDER — ONDANSETRON HCL 4 MG/2ML IJ SOLN: 4.0000 mg | Freq: Four times a day (QID) | INTRAMUSCULAR | Status: DC | PRN

## 2016-12-19 MED ORDER — INFLUENZA VAC SPLIT QUAD 0.5 ML IM SUSY
0.5000 mL | PREFILLED_SYRINGE | INTRAMUSCULAR | Status: AC
  Administered 2016-12-20: 0.5 mL via INTRAMUSCULAR
  Filled 2016-12-19: qty 0.5

## 2016-12-19 MED ORDER — SODIUM CHLORIDE 0.9 % IV BOLUS (SEPSIS): 1000.0000 mL | Freq: Once | INTRAVENOUS | Status: DC

## 2016-12-19 MED ORDER — ENOXAPARIN SODIUM 40 MG/0.4ML ~~LOC~~ SOLN
40.0000 mg | SUBCUTANEOUS | Status: DC
  Administered 2016-12-20: 40 mg via SUBCUTANEOUS
  Filled 2016-12-19 (×2): qty 0.4

## 2016-12-19 MED ORDER — SODIUM CHLORIDE 0.9 % IV BOLUS (SEPSIS)
500.0000 mL | Freq: Once | INTRAVENOUS | Status: AC
Start: 2016-12-19 — End: 2016-12-19
  Administered 2016-12-19: 500 mL via INTRAVENOUS

## 2016-12-19 MED ORDER — VANCOMYCIN HCL IN DEXTROSE 1-5 GM/200ML-% IV SOLN
1000.0000 mg | Freq: Once | INTRAVENOUS | Status: AC
  Administered 2016-12-19: 1000 mg via INTRAVENOUS
  Filled 2016-12-19: qty 200

## 2016-12-19 MED ORDER — DEXTROSE 5 % IV SOLN
500.0000 mg | INTRAVENOUS | Status: DC
  Administered 2016-12-19: 500 mg via INTRAVENOUS
  Filled 2016-12-19 (×2): qty 500

## 2016-12-19 MED ORDER — EST ESTROGENS-METHYLTEST 1.25-2.5 MG PO TABS
1.0000 | ORAL_TABLET | Freq: Every day | ORAL | Status: DC
  Administered 2016-12-20: 1 via ORAL
  Filled 2016-12-19: qty 1

## 2016-12-19 MED ORDER — SODIUM CHLORIDE 0.9 % IV SOLN
1000.0000 mL | Freq: Once | INTRAVENOUS | Status: AC
  Administered 2016-12-19: 1000 mL via INTRAVENOUS

## 2016-12-19 MED ORDER — SODIUM CHLORIDE 0.9 % IV SOLN
1000.0000 mL | INTRAVENOUS | Status: DC
  Administered 2016-12-19 – 2016-12-20 (×3): 1000 mL via INTRAVENOUS

## 2016-12-19 MED ORDER — ONDANSETRON HCL 4 MG PO TABS: 4.0000 mg | ORAL_TABLET | Freq: Four times a day (QID) | ORAL | Status: DC | PRN

## 2016-12-19 MED ORDER — DEXTROSE 5 % IV SOLN
1.0000 g | INTRAVENOUS | Status: DC
  Administered 2016-12-20: 1 g via INTRAVENOUS
  Filled 2016-12-19 (×2): qty 10

## 2016-12-19 MED ORDER — DEXTROSE 5 % IV SOLN: 1.0000 g | INTRAVENOUS | Status: DC

## 2016-12-19 MED ORDER — SIMVASTATIN 20 MG PO TABS: 10.0000 mg | ORAL_TABLET | Freq: Every day | ORAL | Status: DC

## 2016-12-19 MED ORDER — OSELTAMIVIR PHOSPHATE 30 MG PO CAPS
30.0000 mg | ORAL_CAPSULE | Freq: Two times a day (BID) | ORAL | Status: DC
Start: 2016-12-19 — End: 2016-12-20
  Administered 2016-12-19 – 2016-12-20 (×2): 30 mg via ORAL
  Filled 2016-12-19 (×2): qty 1

## 2016-12-19 NOTE — ED Notes (Signed)
Dr Le at bedside.  

## 2016-12-19 NOTE — ED Triage Notes (Signed)
Pt went to urgent care today for a cough that has persisted since last Friday. She has a referral form from there that sates they want to rule out pneumonia and that patients oxygen was reading between 70-80%. Pt oxygen here is 98%. Pt is alert and oriented in triage. Her blood pressure is 75/41. This was taken twice for validation.

## 2016-12-19 NOTE — ED Provider Notes (Signed)
[place] DEPT Provider Note   CSN: [id number] Arrival date & time: 12/19/16  1728     History   Chief Complaint Chief Complaint  Patient presents with  . Cough    HPI [name] is a 56 y.o. female.  HPI  Pt was seen at 1815. Per pt and her family, c/o gradual onset and worsening of persistent cough for the past 5 days. Has been associated with home fevers/chills, generalized chest "pain," and decreased PO intake. Pt was evaluated at a local [facility], then sent to the ED for "hypotension," "low O2 Sats" and "concern for pneumonia." Pt denies palpitations, no abd pain, no N/V/D, no flank pain.   Past Medical History:  Diagnosis Date  . Anxiety   . Back pain of lumbosacaral region with sciatica 01/03/2016  . Complication of anesthesia   . Current use of estrogen therapy 12/02/2014  . DJD (degenerative joint disease), lumbosacral 01/11/2016  . Dysuria 01/19/2014  . Fibrocystic breast changes 01/11/2016  . GERD (gastroesophageal reflux disease)    occ  . H/O estrogen therapy   . Hypertension     Patient Active Problem List   Diagnosis Date Noted  . Spondylolisthesis at L4-L5 level 09/17/2016  . Fibrocystic breast changes 01/11/2016  . DJD (degenerative joint disease), lumbosacral 01/11/2016  . Back pain of lumbosacaral region with sciatica 01/03/2016  . Current use of estrogen therapy 12/02/2014  . Dysuria 01/19/2014  . Anxiety 12/21/2012  . H/O estrogen therapy 12/21/2012  . WRIST PAIN, RIGHT 07/28/2007  . HIGH BLOOD PRESSURE 07/25/2007    Past Surgical History:  Procedure Laterality Date  . APPENDECTOMY  2007   had with bso  . BILATERAL SALPINGOOPHORECTOMY  2007  . CARPAL TUNNEL RELEASE Right    has had 3x  . CESAREAN SECTION    . COLONOSCOPY N/A 01/13/2014   Procedure: COLONOSCOPY;  Surgeon: [name], MD;  [facility];  Service: Endoscopy;  Laterality: N/A;  930  . NECK SURGERY  12/2010  . TOTAL ABDOMINAL HYSTERECTOMY  07/2005    for  menorrhagia and ut. fibroids    OB History    Gravida Para Term Preterm AB Living   2 2       1    SAB TAB Ectopic Multiple Live Births           1       Home Medications    Prior to Admission medications   Medication Sig Start Date End Date Taking? Authorizing Provider  busPIRone (BUSPAR) 5 MG tablet TAKE 1 TABLET (5 MG TOTAL) BY MOUTH 2 (TWO) TIMES DAILY. 12/18/16   Estill Dooms, NP  cholecalciferol (VITAMIN D) 1000 UNITS tablet Take 1,000 Units by mouth daily.     Historical Provider, MD  diazepam (VALIUM) 5 MG tablet Take 1 tablet (5 mg total) by mouth every 6 (six) hours as needed for muscle spasms. 09/18/16   Kristeen Miss, MD  estrogens-methylTEST (ESTRATEST) 1.25-2.5 MG tablet TAKE 1 TABLET EVERY DAY 07/30/16   Estill Dooms, NP  lisinopril (PRINIVIL,ZESTRIL) 10 MG tablet TAKE 1 TABLET (10 MG TOTAL) BY MOUTH DAILY. 02/28/16   Estill Dooms, NP  methocarbamol (ROBAXIN) 500 MG tablet Take 500 mg by mouth daily as needed for muscle spasms.  07/19/16   Historical Provider, MD  naproxen sodium (ANAPROX) 220 MG tablet Take 440 mg by mouth daily as needed (for pain.).     Historical Provider, MD  oxyCODONE-acetaminophen (PERCOCET/ROXICET) 5-325 MG tablet  Take 1-2 tablets by mouth every 4 (four) hours as needed for moderate pain. 09/18/16   Kristeen Miss, MD  simvastatin (ZOCOR) 10 MG tablet Take 1 daily at [facility] Patient not taking: Reported on 09/06/2016 02/09/16   Estill Dooms, NP    Family History Family History  Problem Relation Age of Onset  . Parkinsonism Father   . Alzheimer's disease Father   . Heart attack Brother   . Diabetes Sister     borderline  . Cancer Sister     kidney  . Hypertension Sister   . COPD Mother   . Hypertension Brother   . Colon cancer Neg Hx     Social History Social History  Substance Use Topics  . Smoking status: Former Smoker    Packs/day: 0.25    Years: 5.00    Types: Cigarettes    Quit date: 09/11/1996  . Smokeless  tobacco: Never Used  . Alcohol use No     Allergies   No known allergies   Review of Systems Review of Systems ROS: Statement: All systems negative except as marked or noted in the HPI; Constitutional: +fever and chills. ; ; Eyes: Negative for eye pain, redness and discharge. ; ; ENMT: Negative for ear pain, hoarseness, nasal congestion, sinus pressure and sore throat. ; ; Cardiovascular: +CP. Negative for palpitations, diaphoresis, dyspnea and peripheral edema. ; ; Respiratory: +cough. Negative for wheezing and stridor. ; ; Gastrointestinal: +decreased PO intake. Negative for nausea, vomiting, diarrhea, abdominal pain, blood in stool, hematemesis, jaundice and rectal bleeding. . ; ; Genitourinary: Negative for dysuria, flank pain and hematuria. ; ; Musculoskeletal: Negative for back pain and neck pain. Negative for swelling and trauma.; ; Skin: Negative for pruritus, rash, abrasions, blisters, bruising and skin lesion.; ; Neuro: Negative for headache, lightheadedness and neck stiffness. Negative for weakness, altered level of consciousness, altered mental status, extremity weakness, paresthesias, involuntary movement, seizure and syncope.       Physical Exam Updated Vital Signs BP (!) 101/46   Pulse 95   Temp 99.1 F (37.3 C) (Oral)   Resp 20   Ht 5' 6"  (1.676 m)   Wt 115 lb (52.2 kg)   SpO2 95%   BMI 18.56 kg/m   Patient Vitals for the past 24 hrs:  BP Temp Temp src Pulse Resp SpO2 Height Weight  12/19/16 1945 (!) 101/46 - - 95 20 95 % - -  12/19/16 1943 (!) 95/52 - - 95 (!) 21 94 % - -  12/19/16 1900 (!) 93/46 - - 88 20 98 % - -  12/19/16 1856 (!) 104/47 - - 93 (!) 24 99 % - -  12/19/16 1830 (!) 84/47 - - 86 20 99 % - -  12/19/16 1815 (!) 86/44 - - 88 18 97 % - -  12/19/16 1806 (!) 87/47 - - 99 18 96 % - -  12/19/16 1759 - - - - - - 5' 6"  (1.676 m) 115 lb (52.2 kg)  12/19/16 1758 (!) 75/41 99.1 F (37.3 C) Oral (!) 105 18 97 % - -      Physical Exam 1820: Physical  examination:  Nursing notes reviewed; Vital signs and O2 SAT reviewed;  Constitutional: Well developed, Well nourished, In no acute distress; Head:  Normocephalic, atraumatic; Eyes: EOMI, PERRL, No scleral icterus; ENMT: Mouth and pharynx normal, Mucous membranes dry; Neck: Supple, Full range of motion, No lymphadenopathy; Cardiovascular: Regular rate and rhythm, No gallop; Respiratory: Breath sounds coarse & equal  bilaterally, No wheezes.  Speaking full sentences with ease, Normal respiratory effort/excursion; Chest: Nontender, Movement normal; Abdomen: Soft, Nontender, Nondistended, Normal bowel sounds; Genitourinary: No CVA tenderness; Extremities: Pulses normal, No tenderness, No edema, No calf edema or asymmetry.; Neuro: AA&Ox3, Major CN grossly intact.  Speech clear. No gross focal motor or sensory deficits in extremities.; Skin: Color normal, Warm, Dry.   ED Treatments / Results  Labs (all labs ordered are listed, but only abnormal results are displayed)   EKG  EKG Interpretation  Date/Time:  Wednesday December 19 2016 18:10:50 EDT Ventricular Rate:  89 PR Interval:    QRS Duration: 83 QT Interval:  327 QTC Calculation: 398 R Axis:   80 Text Interpretation:  Sinus rhythm RSR' in V1 or V2, probably normal variant When compared with ECG of 09/11/2016 No significant change was found Confirmed by [facility]  MD, Nunzio Cory [phone]) on 12/19/2016 6:38:32 PM       Radiology   Procedures Procedures (including critical care time)  Medications Ordered in ED Medications  0.9 %  sodium chloride infusion (1,000 mLs Intravenous New Bag/Given 12/19/16 1937)  ceFEPIme (MAXIPIME) 2 g in dextrose 5 % 50 mL IVPB (2 g Intravenous New Bag/Given 12/19/16 1937)  vancomycin (VANCOCIN) IVPB 1000 mg/200 mL premix (0 mg Intravenous Paused 12/19/16 1938)  0.9 %  sodium chloride infusion (0 mLs Intravenous Stopped 12/19/16 1855)  sodium chloride 0.9 % bolus 500 mL (0 mLs Intravenous Stopped 12/19/16 1935)    And    sodium chloride 0.9 % bolus 250 mL (0 mLs Intravenous Stopped 12/19/16 1935)     Initial Impression / Assessment and Plan / ED Course  I have reviewed the triage vital signs and the nursing notes.  Pertinent labs & imaging results that were available during my care of the patient were reviewed by me and considered in my medical decision making (see chart for details).  MDM Reviewed: previous chart, nursing note and vitals Reviewed previous: labs and ECG Interpretation: labs, ECG and x-ray Total time providing critical care: 30-74 minutes. This excludes time spent performing separately reportable procedures and services. Consults: admitting MD   CRITICAL CARE Performed by: Alfonzo Feller Total critical care time: 35 minutes Critical care time was exclusive of separately billable procedures and treating other patients. Critical care was necessary to treat or prevent imminent or life-threatening deterioration. Critical care was time spent personally by me on the following activities: development of treatment plan with patient and/or surrogate as well as nursing, discussions with consultants, evaluation of patient's response to treatment, examination of patient, obtaining history from patient or surrogate, ordering and performing treatments and interventions, ordering and review of laboratory studies, ordering and review of radiographic studies, pulse oximetry and re-evaluation of patient's condition.   Results for orders placed or performed during the hospital encounter of 12/19/16  CBC with Differential  Result Value Ref Range   WBC 8.1 4.0 - 10.5 K/uL   RBC 3.59 (L) 3.87 - 5.11 MIL/uL   Hemoglobin 8.6 (L) 12.0 - 15.0 g/dL   HCT 26.3 (L) 36.0 - 46.0 %   MCV 73.3 (L) 78.0 - 100.0 fL   MCH 24.0 (L) 26.0 - 34.0 pg   MCHC 32.7 30.0 - 36.0 g/dL   RDW 17.7 (H) 11.5 - 15.5 %   Platelets 198 150 - 400 K/uL   Neutrophils Relative % 84 %   Neutro Abs 6.8 1.7 - 7.7 K/uL   Lymphocytes  Relative 11 %   Lymphs Abs 0.9 0.7 -  4.0 K/uL   Monocytes Relative 4 %   Monocytes Absolute 0.4 0.1 - 1.0 K/uL   Eosinophils Relative 0 %   Eosinophils Absolute 0.0 0.0 - 0.7 K/uL   Basophils Relative 0 %   Basophils Absolute 0.0 0.0 - 0.1 K/uL   WBC Morphology WHITE COUNT CONFIRMED ON SMEAR   Comprehensive metabolic panel  Result Value Ref Range   Sodium 127 (L) 135 - 145 mmol/L   Potassium 4.6 3.5 - 5.1 mmol/L   Chloride 96 (L) 101 - 111 mmol/L   CO2 22 22 - 32 mmol/L   Glucose, Bld 87 65 - 99 mg/dL   BUN 19 6 - 20 mg/dL   Creatinine, Ser 1.47 (H) 0.44 - 1.00 mg/dL   Calcium 8.0 (L) 8.9 - 10.3 mg/dL   Total Protein 6.0 (L) 6.5 - 8.1 g/dL   Albumin 2.8 (L) 3.5 - 5.0 g/dL   AST 37 15 - 41 U/L   ALT 30 14 - 54 U/L   Alkaline Phosphatase 46 38 - 126 U/L   Total Bilirubin 0.5 0.3 - 1.2 mg/dL   GFR calc non Af Amer 39 (L) >60 mL/min   GFR calc Af Amer 45 (L) >60 mL/min   Anion gap 9 5 - 15  Troponin I  Result Value Ref Range   Troponin I <0.03 <0.03 ng/mL  I-Stat CG4 Lactic Acid, ED  Result Value Ref Range   Lactic Acid, Venous 1.88 0.5 - 1.9 mmol/L   Dg Chest 2 View Result Date: 12/19/2016 CLINICAL DATA:  Productive cough, shortness of breath, cough EXAM: CHEST  2 VIEW COMPARISON:  None. FINDINGS: Cardiomediastinal silhouette is unremarkable. There is infiltrate with some air bronchogram in left lower lobe retrocardiac best seen on lateral view. This is highly suspicious for pneumonia. Follow-up to resolution is recommended. Degenerative changes mid and lower thoracic spine. IMPRESSION: There is infiltrate with some air bronchogram in left lower lobe retrocardiac best seen on lateral view. This is highly suspicious for pneumonia. Followup PA and lateral chest X-ray is recommended in 3-4 weeks following trial of antibiotic therapy to ensure resolution and exclude underlying malignancy. Electronically Signed   By: Lahoma Crocker M.D.   On: 12/19/2016 18:48    1950:  Pt appears  clinically dehydrated with low BP. New hyponatremia and elevated BUN/Cr on labs today. IVF bolus 9m/kg given with improvement in BP.   IV abx started for infiltrate on CXR. Dx and testing d/w pt and family.  Questions answered.  Verb understanding, agreeable to admit. T/C to [facility] Dr. LMarin Comment case discussed, including:  HPI, pertinent PM/SHx, VS/PE, dx testing, ED course and treatment:  Agreeable to admit.      Final Clinical Impressions(s) / ED Diagnoses   Final diagnoses:  HCAP (healthcare-associated pneumonia)  Hyponatremia  Hypotension, unspecified hypotension type  Dehydration    New Prescriptions New Prescriptions   No medications on file     KFrancine Graven DO 12/23/16 0034

## 2016-12-19 NOTE — Progress Notes (Signed)
PHARMACY NOTE:  ANTIMICROBIAL RENAL DOSAGE ADJUSTMENT  Current antimicrobial regimen includes a mismatch between antimicrobial dosage and estimated renal function.  As per policy approved by the Pharmacy & Therapeutics and Medical Executive Committees, the antimicrobial dosage will be adjusted accordingly.  Current antimicrobial dosage:  tamiflu 75 mg po BID  Indication: suspected influenza  Renal Function:  Estimated Creatinine Clearance: 35.2 mL/min (A) (by C-G formula based on SCr of 1.47 mg/dL (H)). []      On intermittent HD, scheduled: []      On CRRT    Antimicrobial dosage has been changed to:  tamiflu 30 mg po BID  Additional comments:   Thank you for allowing pharmacy to be a part of this patient's care.  Drusilla KannerGrimsley, Valisha Heslin Lydia, [facility] HospitalRPH 12/19/2016 8:49 PM

## 2016-12-19 NOTE — H&P (Signed)
History and Physical    [name] LKG:[id number] DOB: June 19, 1961 DOA: 12/19/2016  PCP: Colette Ribas, MD  Patient coming from: Home.    Chief Complaint:  Feeling ill, coughs, myalgia.   HPI: [name] is an 56 y.o. female with hx HTN, back surgery 3 mo ago, hx of anxiety, presented to the ER with 5 days hx of myalgia, coughs, malaise.  Her husband had flu like symptom about a week ago.  No distant travel.  Work up in the ER showed normal lactic acid, normal WBC and renal fx tests.  CXR showed infiltrate consistent with PNA.  Cr 1.47, Hb of 8.6 with microcytic indices. Na 127.  She was hypotensive with SBP 70's, responded to 100 systolic.   No sign of shock, mentating and perfusing OK.  She was started on IV Van and Cefepime.  Hospitalist was asked to admit her for possible sepsis, likely CAP and volume depletion.  She has no abdominal pain and no chest pain.   ED Course:  See above.  Rewiew of Systems:  Constitutional: Negative for malaise, fever and chills. No significant weight loss or weight gain Eyes: Negative for eye pain, redness and discharge, diplopia, visual changes, or flashes of light. ENMT: Negative for ear pain, hoarseness, nasal congestion, sinus pressure and sore throat. No headaches; tinnitus, drooling, or problem swallowing. Cardiovascular: Negative for chest pain, palpitations, diaphoresis, dyspnea and peripheral edema. ; No orthopnea, PND Respiratory: Negative for hemoptysis, wheezing and stridor. No pleuritic chestpain. Gastrointestinal: Negative for diarrhea, constipation,  melena, blood in stool, hematemesis, jaundice and rectal bleeding.    Genitourinary: Negative for frequency, dysuria, incontinence,flank pain and hematuria; Musculoskeletal: Negative for back pain and neck pain. Negative for swelling and trauma.;  Skin: . Negative for pruritus, rash, abrasions, bruising and skin lesion.; ulcerations Neuro: Negative for headache, lightheadedness and  neck stiffness. Negative for weakness, altered level of consciousness , altered mental status, extremity weakness, burning feet, involuntary movement, seizure and syncope.  Psych: negative for anxiety, depression, insomnia, tearfulness, panic attacks, hallucinations, paranoia, suicidal or homicidal ideation    Past Medical History:  Diagnosis Date  . Anxiety   . Back pain of lumbosacaral region with sciatica 01/03/2016  . Complication of anesthesia   . Current use of estrogen therapy 12/02/2014  . DJD (degenerative joint disease), lumbosacral 01/11/2016  . Dysuria 01/19/2014  . Fibrocystic breast changes 01/11/2016  . GERD (gastroesophageal reflux disease)    occ  . H/O estrogen therapy   . Hypertension     Past Surgical History:  Procedure Laterality Date  . APPENDECTOMY  2007   had with bso  . BILATERAL SALPINGOOPHORECTOMY  2007  . CARPAL TUNNEL RELEASE Right    has had 3x  . CESAREAN SECTION    . COLONOSCOPY N/A 01/13/2014   Procedure: COLONOSCOPY;  Surgeon: Malissa Hippo, MD;  Location: [facility];  Service: Endoscopy;  Laterality: N/A;  930  . NECK SURGERY  12/2010  . TOTAL ABDOMINAL HYSTERECTOMY  07/2005    for menorrhagia and ut. fibroids     reports that she quit smoking about 20 years ago. Her smoking use included Cigarettes. She has a 1.25 pack-year smoking history. She has never used smokeless tobacco. She reports that she does not drink alcohol or use drugs.  Allergies  Allergen Reactions  . No Known Allergies     Family History  Problem Relation Age of Onset  . Parkinsonism Father   . Alzheimer's disease Father   .  Heart attack Brother   . Diabetes Sister     borderline  . Cancer Sister     kidney  . Hypertension Sister   . COPD Mother   . Hypertension Brother   . Colon cancer Neg Hx      Prior to Admission medications   Medication Sig Start Date End Date Taking? Authorizing Provider  amoxicillin (AMOXIL) 500 MG capsule Take 2 capsules by mouth.  Take 2 capsules orally 3 times a day for 2 days then take 2 capsules twice a day for 8 days. 12/19/16  Yes Historical Provider, MD  busPIRone (BUSPAR) 5 MG tablet TAKE 1 TABLET (5 MG TOTAL) BY MOUTH 2 (TWO) TIMES DAILY. 12/18/16  Yes Adline Potter, NP  cholecalciferol (VITAMIN D) 1000 UNITS tablet Take 1,000 Units by mouth daily.    Yes Historical Provider, MD  diazepam (VALIUM) 5 MG tablet Take 1 tablet (5 mg total) by mouth every 6 (six) hours as needed for muscle spasms. 09/18/16  Yes Barnett Abu, MD  doxycycline (VIBRAMYCIN) 100 MG capsule Take 1 capsule by mouth 2 (two) times daily. For infection. 12/19/16  Yes Historical Provider, MD  estrogens-methylTEST (ESTRATEST) 1.25-2.5 MG tablet TAKE 1 TABLET EVERY DAY 07/30/16  Yes Adline Potter, NP  lisinopril (PRINIVIL,ZESTRIL) 10 MG tablet TAKE 1 TABLET (10 MG TOTAL) BY MOUTH DAILY. 02/28/16  Yes Adline Potter, NP  naproxen sodium (ANAPROX) 220 MG tablet Take 440 mg by mouth daily as needed (for pain.).    Yes Historical Provider, MD  oxyCODONE-acetaminophen (PERCOCET/ROXICET) 5-325 MG tablet Take 1-2 tablets by mouth every 4 (four) hours as needed for moderate pain. 09/18/16  Yes Barnett Abu, MD  ranitidine (ZANTAC) 150 MG tablet Take 150 mg by mouth daily.   Yes Historical Provider, MD  simvastatin (ZOCOR) 10 MG tablet Take 1 daily at [facility] 02/09/16  Yes Adline Potter, NP    Physical Exam: Vitals:   12/19/16 1900 12/19/16 1943 12/19/16 1945 12/19/16 2000  BP: (!) 93/46 (!) 95/52 (!) 101/46 95/63  Pulse: 88 95 95 (!) 103  Resp: 20 (!) 21 20 (!) 23  Temp:      TempSrc:      SpO2: 98% 94% 95% 92%  Weight:      Height:          Constitutional: NAD, calm, comfortable Vitals:   12/19/16 1900 12/19/16 1943 12/19/16 1945 12/19/16 2000  BP: (!) 93/46 (!) 95/52 (!) 101/46 95/63  Pulse: 88 95 95 (!) 103  Resp: 20 (!) 21 20 (!) 23  Temp:      TempSrc:      SpO2: 98% 94% 95% 92%  Weight:      Height:       Eyes: PERRL, lids  and conjunctivae normal ENMT: Mucous membranes are moist. Posterior pharynx clear of any exudate or lesions.Normal dentition.  Neck: normal, supple, no masses, no thyromegaly Respiratory: scattered rhonchi, no wheezing, no crackles. Normal respiratory effort. No accessory muscle use.  Cardiovascular: Regular rate and rhythm, no murmurs / rubs / gallops. No extremity edema. 2+ pedal pulses. No carotid bruits.  Abdomen: no tenderness, no masses palpated. No hepatosplenomegaly. Bowel sounds positive.  Musculoskeletal: no clubbing / cyanosis. No joint deformity upper and lower extremities. Good ROM, no contractures. Normal muscle tone.  Skin: no rashes, lesions, ulcers. No induration Neurologic: CN 2-12 grossly intact. Sensation intact, DTR normal. Strength 5/5 in all 4.  Psychiatric: Normal judgment and insight. Alert and oriented x 3.  Normal mood.     Labs on Admission: I have personally reviewed following labs and imaging studies  CBC:  Recent Labs Lab 12/19/16 1811  WBC 8.1  NEUTROABS 6.8  HGB 8.6*  HCT 26.3*  MCV 73.3*  PLT 198   Basic Metabolic Panel:  Recent Labs Lab 12/19/16 1811  NA 127*  K 4.6  CL 96*  CO2 22  GLUCOSE 87  BUN 19  CREATININE 1.47*  CALCIUM 8.0*   GFR: Estimated Creatinine Clearance: 35.2 mL/min (A) (by C-G formula based on SCr of 1.47 mg/dL (H)). Liver Function Tests:  Recent Labs Lab 12/19/16 1811  AST 37  ALT 30  ALKPHOS 46  BILITOT 0.5  PROT 6.0*  ALBUMIN 2.8*   Cardiac Enzymes:  Recent Labs Lab 12/19/16 1838  TROPONINI <0.03   BNP (last 3 results) Urine analysis:    Component Value Date/Time   COLORURINE YELLOW 01/19/2014 0912   APPEARANCEUR CLEAR 01/19/2014 0912   LABSPEC <1.005 (L) 01/19/2014 0912   PHURINE 6.0 01/19/2014 0912   GLUCOSEU NEG 01/19/2014 0912   HGBUR NEG 01/19/2014 0912   BILIRUBINUR NEG 01/19/2014 0912   KETONESUR NEG 01/19/2014 0912   PROTEINUR neg 10/06/2015 1404   PROTEINUR NEG 01/19/2014 0912    UROBILINOGEN 0.2 01/19/2014 0912   NITRITE neg 10/06/2015 1404   NITRITE NEG 01/19/2014 0912   LEUKOCYTESUR Negative 10/06/2015 1404    Recent Results (from the past 240 hour(s))  Blood Culture (routine x 2)     Status: None (Preliminary result)   Collection Time: 12/19/16  6:37 PM  Result Value Ref Range Status   Specimen Description BLOOD LEFT ARM  Final   Special Requests BOTTLES DRAWN AEROBIC AND ANAEROBIC [facility] EACH  Final   Culture PENDING  Incomplete   Report Status PENDING  Incomplete  Blood Culture (routine x 2)     Status: None (Preliminary result)   Collection Time: 12/19/16  6:38 PM  Result Value Ref Range Status   Specimen Description LEFT ANTECUBITAL  Final   Special Requests BOTTLES DRAWN AEROBIC AND ANAEROBIC [facility] EACH  Final   Culture PENDING  Incomplete   Report Status PENDING  Incomplete     Radiological Exams on Admission: Dg Chest 2 View  Result Date: 12/19/2016 CLINICAL DATA:  Productive cough, shortness of breath, cough EXAM: CHEST  2 VIEW COMPARISON:  None. FINDINGS: Cardiomediastinal silhouette is unremarkable. There is infiltrate with some air bronchogram in left lower lobe retrocardiac best seen on lateral view. This is highly suspicious for pneumonia. Follow-up to resolution is recommended. Degenerative changes mid and lower thoracic spine. IMPRESSION: There is infiltrate with some air bronchogram in left lower lobe retrocardiac best seen on lateral view. This is highly suspicious for pneumonia. Followup PA and lateral chest X-ray is recommended in 3-4 weeks following trial of antibiotic therapy to ensure resolution and exclude underlying malignancy. Electronically Signed   By: Natasha Mead M.D.   On: 12/19/2016 18:48    EKG: Independently reviewed.   Assessment/Plan Principal Problem:   CAP (community acquired pneumonia) Active Problems:   H/O estrogen therapy   Spondylolisthesis at L4-L5 level   Dehydration    PLAN:   CAP:  Will tx with IV Rocephin  and Zithromax.    Hypotension:  I think she is volume depleted.  She also has Na of 127, and elevated Cr also.  Will continue with IVF, NS.  Follow Na.  Hypovolemic hyponatremia:  Will follow Na.  Continue with IVF.  Anemia:  Check anemia panel.  Suspect iron deficiency anemia.   No evidence of bleeding, but Hct may drop further with plebotomy and IVF.   DVT prophylaxis: Lovenox. Code Status: Full Code.  Family Communication: Husband at bedside.  Disposition Plan: Home.  Consults called: None.  Admission status: Inpatient.    Mckinlee Dunk MD FACP. [facility]  If 7PM-7AM, please contact night-coverage [email or web address] Password [email or web address]  12/19/2016, 8:52 PM

## 2016-12-19 NOTE — ED Notes (Signed)
Family at bedside. 

## 2016-12-20 DIAGNOSIS — J101 Influenza due to other identified influenza virus with other respiratory manifestations: Secondary | ICD-10-CM

## 2016-12-20 DIAGNOSIS — J189 Pneumonia, unspecified organism: Secondary | ICD-10-CM

## 2016-12-20 LAB — BASIC METABOLIC PANEL
ANION GAP: 6 (ref 5–15)
BUN: 14 mg/dL (ref 6–20)
CALCIUM: 7.4 mg/dL — AB (ref 8.9–10.3)
CO2: 20 mmol/L — ABNORMAL LOW (ref 22–32)
CREATININE: 1.1 mg/dL — AB (ref 0.44–1.00)
Chloride: 108 mmol/L (ref 101–111)
GFR, EST NON AFRICAN AMERICAN: 55 mL/min — AB (ref >60)
Glucose, Bld: 55 mg/dL — ABNORMAL LOW (ref 65–99)
Potassium: 5 mmol/L (ref 3.5–5.1)
Sodium: 134 mmol/L — ABNORMAL LOW (ref 135–145)

## 2016-12-20 LAB — FOLATE: FOLATE: 5.3 ng/mL — AB (ref >5.9)

## 2016-12-20 LAB — CBC
HCT: 24.7 % — ABNORMAL LOW (ref 36.0–46.0)
HEMOGLOBIN: 7.9 g/dL — AB (ref 12.0–15.0)
MCH: 23.7 pg — ABNORMAL LOW (ref 26.0–34.0)
MCHC: 32 g/dL (ref 30.0–36.0)
MCV: 74.2 fL — ABNORMAL LOW (ref 78.0–100.0)
PLATELETS: 195 10*3/uL (ref 150–400)
RBC: 3.33 MIL/uL — AB (ref 3.87–5.11)
RDW: 18.1 % — ABNORMAL HIGH (ref 11.5–15.5)
WBC: 7.8 10*3/uL (ref 4.0–10.5)

## 2016-12-20 LAB — IRON AND TIBC
Iron: <5 ug/dL — ABNORMAL LOW (ref 28–170)
TIBC: 267 ug/dL (ref 250–450)

## 2016-12-20 LAB — MRSA PCR SCREENING: MRSA BY PCR: NEGATIVE

## 2016-12-20 LAB — VITAMIN B12: Vitamin B-12: 913 pg/mL (ref 180–914)

## 2016-12-20 LAB — FERRITIN: Ferritin: 64 ng/mL (ref 11–307)

## 2016-12-20 MED ORDER — LEVOFLOXACIN 500 MG PO TABS: 500.0000 mg | ORAL_TABLET | ORAL | Status: DC

## 2016-12-20 MED ORDER — LEVOFLOXACIN 750 MG PO TABS: 750.0000 mg | ORAL_TABLET | Freq: Every day | ORAL | Status: DC

## 2016-12-20 MED ORDER — OSELTAMIVIR PHOSPHATE 30 MG PO CAPS: 30.0000 mg | ORAL_CAPSULE | Freq: Two times a day (BID) | ORAL | 0 refills | Status: DC

## 2016-12-20 MED ORDER — LEVOFLOXACIN 500 MG PO TABS: 500.0000 mg | ORAL_TABLET | Freq: Every day | ORAL | 0 refills | Status: DC

## 2016-12-20 NOTE — Care Management Note (Signed)
Case Management Note  Patient Details  Name: Desiree BustardSabrina B Lynn MRN: [id number] Date of Birth: 02/15/1961  Subjective/Objective:                  Admitted with CAP. Chart reviewed for CM needs. Pta from home, lives with family, ind with ADL's, has PCP, transportation and insurance with drug coverage. No needs communicated.  Action/Plan: Discharging home with self care.   Expected Discharge Date:  12/20/16               Expected Discharge Plan:  Home/Self Care  In-House Referral:  NA  Discharge planning Services  CM Consult  Post Acute Care Choice:  NA Choice offered to:  NA  Status of Service:  Completed, signed off  Malcolm MetroChildress, Jody Aguinaga Demske, RN 12/20/2016, 4:09 PM

## 2016-12-20 NOTE — Discharge Summary (Signed)
Physician Discharge Summary  [name] NFA:[id number] DOB: 03-21-1961 DOA: 12/19/2016  PCP: Purvis Kilts, MD  Admit date: 12/19/2016 Discharge date: 12/20/2016  Time spent: 45 minutes  Recommendations for Outpatient Follow-up:  -Will be discharged home today. -Advised to complete course of tamiflu and levaquin and to see PCP in 2 weeks.   Discharge Diagnoses:  Principal Problem:   CAP (community acquired pneumonia) Active Problems:   Influenza B   H/O estrogen therapy   Spondylolisthesis at L4-L5 level   Dehydration   Discharge Condition: Stable and improved  Filed Weights   12/19/16 1759 12/19/16 2313 12/20/16 0500  Weight: 52.2 kg (115 lb) 52.8 kg (116 lb 6.5 oz) 52.2 kg (115 lb 1.3 oz)    History of present illness:  As per Dr. Marin Comment on 3/21: [name] is an 56 y.o. female with hx HTN, back surgery 3 mo ago, hx of anxiety, presented to the ER with 5 days hx of myalgia, coughs, malaise.  Her husband had flu like symptom about a week ago.  No distant travel.  Work up in the ER showed normal lactic acid, normal WBC and renal fx tests.  CXR showed infiltrate consistent with PNA.  Cr 1.47, Hb of 8.6 with microcytic indices. Na 127.  She was hypotensive with SBP 70's, responded to 469 systolic.   No sign of shock, mentating and perfusing OK.  She was started on IV Van and Cefepime.  Hospitalist was asked to admit her for possible sepsis, likely CAP and volume depletion.  She has no abdominal pain and no chest pain.    Hospital Course:   CAP -Cx data remains negative to date. -Will DC on levaquin for 7 days.  Influenza B -Continue tamiflu for 5 days.  Hypotension -Do not believe patient was septic on admission. Believe hypotension was a product of intravascular volume depletion from severe dehydration due to poor PO intake and GI losses. -Resolved with IVF repletion.  Hyponatremia -Resolved with IVF.  ARF -Resolved with IVF.  Procedures:  None     Consultations:  None  Discharge Instructions  Discharge Instructions    Diet - low sodium heart healthy    Complete by:  As directed    Increase activity slowly    Complete by:  As directed      Allergies as of 12/20/2016      Reactions   No Known Allergies       Medication List    STOP taking these medications   amoxicillin 500 MG capsule Commonly known as:  AMOXIL   doxycycline 100 MG capsule Commonly known as:  VIBRAMYCIN   naproxen sodium 220 MG tablet Commonly known as:  ANAPROX     TAKE these medications   busPIRone 5 MG tablet Commonly known as:  BUSPAR TAKE 1 TABLET (5 MG TOTAL) BY MOUTH 2 (TWO) TIMES DAILY.   cholecalciferol 1000 units tablet Commonly known as:  VITAMIN D Take 1,000 Units by mouth daily.   diazepam 5 MG tablet Commonly known as:  VALIUM Take 1 tablet (5 mg total) by mouth every 6 (six) hours as needed for muscle spasms.   estrogens-methylTEST 1.25-2.5 MG tablet Commonly known as:  ESTRATEST TAKE 1 TABLET EVERY DAY   levofloxacin 500 MG tablet Commonly known as:  LEVAQUIN Take 1 tablet (500 mg total) by mouth daily.   lisinopril 10 MG tablet Commonly known as:  PRINIVIL,ZESTRIL TAKE 1 TABLET (10 MG TOTAL) BY MOUTH DAILY.  oseltamivir 30 MG capsule Commonly known as:  TAMIFLU Take 1 capsule (30 mg total) by mouth 2 (two) times daily.   oxyCODONE-acetaminophen 5-325 MG tablet Commonly known as:  PERCOCET/ROXICET Take 1-2 tablets by mouth every 4 (four) hours as needed for moderate pain.   ranitidine 150 MG tablet Commonly known as:  ZANTAC Take 150 mg by mouth daily.   simvastatin 10 MG tablet Commonly known as:  ZOCOR Take 1 daily at HS      Allergies  Allergen Reactions  . No Known Allergies    Follow-up Information    Purvis Kilts, MD. Schedule an appointment as soon as possible for a visit in 2 week(s).   Specialty:  Family Medicine Contact information: [address] [place]  [address] [phone]            The results of significant diagnostics from this hospitalization (including imaging, microbiology, ancillary and laboratory) are listed below for reference.    Significant Diagnostic Studies: Dg Chest 2 View  Result Date: 12/19/2016 CLINICAL DATA:  Productive cough, shortness of breath, cough EXAM: CHEST  2 VIEW COMPARISON:  None. FINDINGS: Cardiomediastinal silhouette is unremarkable. There is infiltrate with some air bronchogram in left lower lobe retrocardiac best seen on lateral view. This is highly suspicious for pneumonia. Follow-up to resolution is recommended. Degenerative changes mid and lower thoracic spine. IMPRESSION: There is infiltrate with some air bronchogram in left lower lobe retrocardiac best seen on lateral view. This is highly suspicious for pneumonia. Followup PA and lateral chest X-ray is recommended in 3-4 weeks following trial of antibiotic therapy to ensure resolution and exclude underlying malignancy. Electronically Signed   By: Lahoma Crocker M.D.   On: 12/19/2016 18:48    Microbiology: Recent Results (from the past 240 hour(s))  Blood Culture (routine x 2)     Status: None (Preliminary result)   Collection Time: 12/19/16  6:37 PM  Result Value Ref Range Status   Specimen Description BLOOD LEFT ARM  Final   Special Requests BOTTLES DRAWN AEROBIC AND ANAEROBIC [place] EACH  Final   Culture PENDING  Incomplete   Report Status PENDING  Incomplete  Blood Culture (routine x 2)     Status: None (Preliminary result)   Collection Time: 12/19/16  6:38 PM  Result Value Ref Range Status   Specimen Description LEFT ANTECUBITAL  Final   Special Requests BOTTLES DRAWN AEROBIC AND ANAEROBIC [facility] EACH  Final   Culture PENDING  Incomplete   Report Status PENDING  Incomplete  MRSA PCR Screening     Status: None   Collection Time: 12/19/16 11:09 PM  Result Value Ref Range Status   MRSA by PCR NEGATIVE NEGATIVE Final    Comment:        The GeneXpert  MRSA Assay (FDA approved for NASAL specimens only), is one component of a comprehensive MRSA colonization surveillance program. It is not intended to diagnose MRSA infection nor to guide or monitor treatment for MRSA infections.      Labs: Basic Metabolic Panel:  Recent Labs Lab 12/19/16 1811 12/20/16 0448  NA 127* 134*  K 4.6 5.0  CL 96* 108  CO2 22 20*  GLUCOSE 87 55*  BUN 19 14  CREATININE 1.47* 1.10*  CALCIUM 8.0* 7.4*   Liver Function Tests:  Recent Labs Lab 12/19/16 1811  AST 37  ALT 30  ALKPHOS 46  BILITOT 0.5  PROT 6.0*  ALBUMIN 2.8*   No results for input(s): LIPASE, AMYLASE in the  last 168 hours. No results for input(s): AMMONIA in the last 168 hours. CBC:  Recent Labs Lab 12/19/16 1811 12/20/16 0448  WBC 8.1 7.8  NEUTROABS 6.8  --   HGB 8.6* 7.9*  HCT 26.3* 24.7*  MCV 73.3* 74.2*  PLT 198 195   Cardiac Enzymes:  Recent Labs Lab 12/19/16 1838  TROPONINI <0.03   BNP: BNP (last 3 results) No results for input(s): BNP in the last 8760 hours.  ProBNP (last 3 results) No results for input(s): PROBNP in the last 8760 hours.  CBG: No results for input(s): GLUCAP in the last 168 hours.     SignedLelon Frohlich  [facility] Pager: [phone] 12/20/2016, 5:07 PM

## 2016-12-21 LAB — HIV ANTIBODY (ROUTINE TESTING W REFLEX): HIV SCREEN 4TH GENERATION: NONREACTIVE

## 2016-12-24 LAB — CULTURE, BLOOD (ROUTINE X 2)
CULTURE: NO GROWTH
Culture: NO GROWTH

## 2017-01-02 ENCOUNTER — Telehealth: Payer: Self-pay | Admitting: Adult Health

## 2017-01-02 DIAGNOSIS — R7989 Other specified abnormal findings of blood chemistry: Secondary | ICD-10-CM

## 2017-01-02 DIAGNOSIS — R5383 Other fatigue: Secondary | ICD-10-CM

## 2017-01-02 NOTE — Telephone Encounter (Signed)
She also has swelling in feet to increase water and will check labs

## 2017-01-02 NOTE — Telephone Encounter (Signed)
Pt needs labs to recheck kidney function and will recheck CBC had recent flu and pneumonia

## 2017-01-04 ENCOUNTER — Telehealth: Payer: Self-pay | Admitting: Adult Health

## 2017-01-04 DIAGNOSIS — D508 Other iron deficiency anemias: Secondary | ICD-10-CM

## 2017-01-04 LAB — COMPREHENSIVE METABOLIC PANEL
ALK PHOS: 55 IU/L (ref 39–117)
ALT: 29 IU/L (ref 0–32)
AST: 30 IU/L (ref 0–40)
Albumin/Globulin Ratio: 1 — ABNORMAL LOW (ref 1.2–2.2)
Albumin: 3 g/dL — ABNORMAL LOW (ref 3.5–5.5)
BUN/Creatinine Ratio: 11 (ref 9–23)
BUN: 10 mg/dL (ref 6–24)
Bilirubin Total: 0.3 mg/dL (ref 0.0–1.2)
CALCIUM: 8.4 mg/dL — AB (ref 8.7–10.2)
CO2: 24 mmol/L (ref 18–29)
CREATININE: 0.95 mg/dL (ref 0.57–1.00)
Chloride: 99 mmol/L (ref 96–106)
GFR calc Af Amer: 77 mL/min/{1.73_m2} (ref >59)
GFR calc non Af Amer: 67 mL/min/{1.73_m2} (ref >59)
GLOBULIN, TOTAL: 3.1 g/dL (ref 1.5–4.5)
GLUCOSE: 74 mg/dL (ref 65–99)
Potassium: 5.2 mmol/L (ref 3.5–5.2)
SODIUM: 137 mmol/L (ref 134–144)
Total Protein: 6.1 g/dL (ref 6.0–8.5)

## 2017-01-04 LAB — CBC
Hematocrit: 27.5 % — ABNORMAL LOW (ref 34.0–46.6)
Hemoglobin: 7.9 g/dL — ABNORMAL LOW (ref 11.1–15.9)
MCH: 22.8 pg — ABNORMAL LOW (ref 26.6–33.0)
MCHC: 28.7 g/dL — AB (ref 31.5–35.7)
MCV: 80 fL (ref 79–97)
PLATELETS: 758 10*3/uL — AB (ref 150–379)
RBC: 3.46 x10E6/uL — ABNORMAL LOW (ref 3.77–5.28)
RDW: 20.1 % — AB (ref 12.3–15.4)
WBC: 7.9 10*3/uL (ref 3.4–10.8)

## 2017-01-04 NOTE — Telephone Encounter (Signed)
Spoke with [facility], wil let pt know she is available if needed.

## 2017-01-04 NOTE — Telephone Encounter (Signed)
Pt aware of labs,and kidney labs but hgb low,no know bleeding, but feels like hit by a truck ,will refer to GI, Dr Laural Golden to evaluate

## 2017-01-09 ENCOUNTER — Telehealth (INDEPENDENT_AMBULATORY_CARE_PROVIDER_SITE_OTHER): Payer: Self-pay | Admitting: Internal Medicine

## 2017-01-09 ENCOUNTER — Encounter (INDEPENDENT_AMBULATORY_CARE_PROVIDER_SITE_OTHER): Payer: Self-pay | Admitting: Internal Medicine

## 2017-01-09 ENCOUNTER — Ambulatory Visit (INDEPENDENT_AMBULATORY_CARE_PROVIDER_SITE_OTHER): Payer: BLUE CROSS/BLUE SHIELD | Admitting: Internal Medicine

## 2017-01-09 VITALS — BP 152/64 | HR 76 | Temp 98.2°F | Ht 66.0 in | Wt 113.2 lb

## 2017-01-09 DIAGNOSIS — D508 Other iron deficiency anemias: Secondary | ICD-10-CM

## 2017-01-09 LAB — CBC WITH DIFFERENTIAL/PLATELET
BASOS PCT: 1 %
Basophils Absolute: 71 cells/uL (ref 0–200)
Eosinophils Absolute: 142 cells/uL (ref 15–500)
Eosinophils Relative: 2 %
HCT: 26.9 % — ABNORMAL LOW (ref 35.0–45.0)
Hemoglobin: 7.7 g/dL — ABNORMAL LOW (ref 11.7–15.5)
LYMPHS ABS: 2414 {cells}/uL (ref 850–3900)
Lymphocytes Relative: 34 %
MCH: 22.1 pg — ABNORMAL LOW (ref 27.0–33.0)
MCHC: 28.6 g/dL — ABNORMAL LOW (ref 32.0–36.0)
MCV: 77.3 fL — ABNORMAL LOW (ref 80.0–100.0)
MONO ABS: 568 {cells}/uL (ref 200–950)
MPV: 8.5 fL (ref 7.5–12.5)
Monocytes Relative: 8 %
NEUTROS PCT: 55 %
Neutro Abs: 3905 cells/uL (ref 1500–7800)
PLATELETS: 506 10*3/uL — AB (ref 140–400)
RBC: 3.48 MIL/uL — AB (ref 3.80–5.10)
RDW: 19.7 % — AB (ref 11.0–15.0)
WBC: 7.1 10*3/uL (ref 3.8–10.8)

## 2017-01-09 LAB — IRON AND TIBC
%SAT: 3 % — ABNORMAL LOW (ref 11–50)
IRON: 10 ug/dL — AB (ref 45–160)
TIBC: 297 ug/dL (ref 250–450)
UIBC: 287 ug/dL (ref 125–400)

## 2017-01-09 LAB — FERRITIN: Ferritin: 15 ng/mL (ref 10–232)

## 2017-01-09 NOTE — Patient Instructions (Signed)
Labs. Stool cards x 3.

## 2017-01-09 NOTE — Telephone Encounter (Signed)
error 

## 2017-01-09 NOTE — Telephone Encounter (Signed)
[name], we need to T and C for 2 units

## 2017-01-09 NOTE — Progress Notes (Signed)
Subjective:    Patient ID: [name], female    DOB: 10-27-1960, 56 y.o.   MRN: [id number]  HPI Referred by Cyril Mourning NP-C for anemia. She had normal hemoglobin 01/03/2016. She denies any rectal bleeding.  Stools are yellow in color.  No change in her stools. Her appetite is not good. She has not lost any weight. No family hx of colon cancer. She has acid reflux on occasion when she eats spicy foods. Takes Zantac prn.  Back surgery in December of 2017 by Dr. Danielle Dess.  No abdominal pain.   CBC Latest Ref Rng & Units 01/03/2017 12/20/2016 12/19/2016  WBC 3.4 - 10.8 x10E3/uL 7.9 7.8 8.1  Hemoglobin 12.0 - 15.0 g/dL - 7.9(L) 8.6(L)  Hematocrit 34.0 - 46.6 % 27.5(L) 24.7(L) 26.3(L)  Platelets 150 - 379 x10E3/uL 758(H) 195 198   12/19/2016 Ferritin 64, Iron less than 5, TIBC 267  01/13/2014 Colonoscopy: average risk.  Findings:   Prep excellent. Normal mucosa of the cecum, ascending colon, hepatic flexure, transverse colon, splenic flexure, descending and sigmoid colon. Normal mucosa of rectum. Hemorrhoids noted above and below the dentate line. Review of Systems Past Medical History:  Diagnosis Date  . Anxiety   . Back pain of lumbosacaral region with sciatica 01/03/2016  . Complication of anesthesia   . Current use of estrogen therapy 12/02/2014  . DJD (degenerative joint disease), lumbosacral 01/11/2016  . Dysuria 01/19/2014  . Fibrocystic breast changes 01/11/2016  . GERD (gastroesophageal reflux disease)    occ  . H/O estrogen therapy   . Hypertension     Past Surgical History:  Procedure Laterality Date  . APPENDECTOMY  2007   had with bso  . BILATERAL SALPINGOOPHORECTOMY  2007  . CARPAL TUNNEL RELEASE Right    has had 3x  . CESAREAN SECTION    . COLONOSCOPY N/A 01/13/2014   Procedure: COLONOSCOPY;  Surgeon: Malissa Hippo, MD;  Location: [facility];  Service: Endoscopy;  Laterality: N/A;  930  . NECK SURGERY  12/2010  . TOTAL ABDOMINAL HYSTERECTOMY  07/2005   for menorrhagia and ut. fibroids    Allergies  Allergen Reactions  . No Known Allergies     Current Outpatient Prescriptions on File Prior to Visit  Medication Sig Dispense Refill  . busPIRone (BUSPAR) 5 MG tablet TAKE 1 TABLET (5 MG TOTAL) BY MOUTH 2 (TWO) TIMES DAILY. 180 tablet 4  . estrogens-methylTEST (ESTRATEST) 1.25-2.5 MG tablet TAKE 1 TABLET EVERY DAY 90 tablet 1  . lisinopril (PRINIVIL,ZESTRIL) 10 MG tablet TAKE 1 TABLET (10 MG TOTAL) BY MOUTH DAILY. 90 tablet 3  . ranitidine (ZANTAC) 150 MG tablet Take 150 mg by mouth as needed.     . simvastatin (ZOCOR) 10 MG tablet Take 1 daily at HS 90 tablet 3   No current facility-administered medications on file prior to visit.        Objective:   Physical Exam Blood pressure (!) 152/64, pulse 76, temperature 98.2 F (36.8 C), height  (1.676 m), weight 113 lb 3.2 oz (51.3 kg).Alert and oriented. Skin warm and dry. Oral mucosa is moist.   . Sclera anicteric, conjunctivae is pink. Thyroid not enlarged. No cervical lymphadenopathy. Lungs clear. Heart regular rate and rhythm.  Abdomen is soft. Bowel sounds are positive. No hepatomegaly. No abdominal masses felt. No tenderness.  No edema to lower extremities.   Stool brown and guaiac negative.     Assessment & Plan:  IDA. Iron studies, CBC today.  Colonoscopy in 2015 was normal  3 stool cards home with patient. May need EGD. Will discuss with Dr Karilyn Cota

## 2017-01-10 ENCOUNTER — Ambulatory Visit (INDEPENDENT_AMBULATORY_CARE_PROVIDER_SITE_OTHER): Payer: BLUE CROSS/BLUE SHIELD | Admitting: Adult Health

## 2017-01-10 ENCOUNTER — Ambulatory Visit (HOSPITAL_COMMUNITY)
Admission: RE | Admit: 2017-01-10 | Discharge: 2017-01-10 | Disposition: A | Discharge status: Home / Self Care | Payer: BLUE CROSS/BLUE SHIELD | Source: Ambulatory Visit | Attending: Internal Medicine | Admitting: Internal Medicine

## 2017-01-10 ENCOUNTER — Encounter (HOSPITAL_COMMUNITY)
Admission: RE | Admit: 2017-01-10 | Discharge: 2017-01-10 | Disposition: A | Discharge status: Home / Self Care | Payer: BLUE CROSS/BLUE SHIELD | Source: Ambulatory Visit | Attending: Internal Medicine | Admitting: Internal Medicine

## 2017-01-10 ENCOUNTER — Encounter: Payer: Self-pay | Admitting: Adult Health

## 2017-01-10 ENCOUNTER — Telehealth (INDEPENDENT_AMBULATORY_CARE_PROVIDER_SITE_OTHER): Payer: Self-pay | Admitting: Internal Medicine

## 2017-01-10 VITALS — BP 122/82 | HR 68 | Ht 62.0 in | Wt 112.0 lb

## 2017-01-10 DIAGNOSIS — J189 Pneumonia, unspecified organism: Secondary | ICD-10-CM | POA: Insufficient documentation

## 2017-01-10 DIAGNOSIS — D649 Anemia, unspecified: Secondary | ICD-10-CM | POA: Insufficient documentation

## 2017-01-10 DIAGNOSIS — Z79899 Other long term (current) drug therapy: Secondary | ICD-10-CM

## 2017-01-10 DIAGNOSIS — Z01419 Encounter for gynecological examination (general) (routine) without abnormal findings: Secondary | ICD-10-CM

## 2017-01-10 DIAGNOSIS — R634 Abnormal weight loss: Secondary | ICD-10-CM | POA: Insufficient documentation

## 2017-01-10 DIAGNOSIS — I700 Atherosclerosis of aorta: Secondary | ICD-10-CM | POA: Insufficient documentation

## 2017-01-10 DIAGNOSIS — Z1211 Encounter for screening for malignant neoplasm of colon: Secondary | ICD-10-CM

## 2017-01-10 DIAGNOSIS — Z1212 Encounter for screening for malignant neoplasm of rectum: Secondary | ICD-10-CM

## 2017-01-10 DIAGNOSIS — D508 Other iron deficiency anemias: Secondary | ICD-10-CM

## 2017-01-10 DIAGNOSIS — I10 Essential (primary) hypertension: Secondary | ICD-10-CM | POA: Insufficient documentation

## 2017-01-10 DIAGNOSIS — R7989 Other specified abnormal findings of blood chemistry: Secondary | ICD-10-CM

## 2017-01-10 DIAGNOSIS — Z1329 Encounter for screening for other suspected endocrine disorder: Secondary | ICD-10-CM | POA: Insufficient documentation

## 2017-01-10 DIAGNOSIS — F419 Anxiety disorder, unspecified: Secondary | ICD-10-CM

## 2017-01-10 LAB — TYPE AND SCREEN
ABO/RH(D): A POS
ANTIBODY SCREEN: NEGATIVE
Unit division: 0
Unit division: 0

## 2017-01-10 LAB — BPAM RBC
Blood Product Expiration Date: 201805022359
Blood Product Expiration Date: 201805022359
UNIT TYPE AND RH: 6200
UNIT TYPE AND RH: 6200

## 2017-01-10 LAB — HEMOCCULT GUIAC POC 1CARD (OFFICE): FECAL OCCULT BLD: NEGATIVE

## 2017-01-10 LAB — HEMOGLOBIN AND HEMATOCRIT, BLOOD
HEMATOCRIT: 28 % — AB (ref 36.0–46.0)
HEMOGLOBIN: 8.5 g/dL — AB (ref 12.0–15.0)

## 2017-01-10 LAB — PREPARE RBC (CROSSMATCH)

## 2017-01-10 LAB — ABO/RH: ABO/RH(D): A POS

## 2017-01-10 MED ORDER — LISINOPRIL 10 MG PO TABS: ORAL_TABLET | ORAL | 3 refills | Status: DC

## 2017-01-10 MED ORDER — EST ESTROGENS-METHYLTEST 1.25-2.5 MG PO TABS: 1.0000 | ORAL_TABLET | Freq: Every day | ORAL | 3 refills | Status: DC

## 2017-01-10 MED ORDER — SIMVASTATIN 10 MG PO TABS: ORAL_TABLET | ORAL | 3 refills | Status: DC

## 2017-01-10 MED ORDER — BUSPIRONE HCL 5 MG PO TABS: 5.0000 mg | ORAL_TABLET | Freq: Two times a day (BID) | ORAL | 4 refills | Status: DC

## 2017-01-10 NOTE — Progress Notes (Signed)
Order given per Dr Karilyn Cota, to repeat Hgb on Monday.

## 2017-01-10 NOTE — Telephone Encounter (Signed)
Am going to get a CXR for f/u of her recent admission for pneumonia

## 2017-01-10 NOTE — Telephone Encounter (Signed)
The order has been sent to [facility] Short Stay. They will contact the patient with her times and the dates.

## 2017-01-10 NOTE — Progress Notes (Signed)
Talked with Tammy, Dr Patty Sermons nurse, Ursula Beath NP not available til Monday. Informed of Dr Patty Sermons orders. Will inform T Setzer when she returns to work on Monday. Pt notified that blood transfusion has been canceled and hgb to be repeated on Monday. Pt unable to come on Monday for appt. appt made for Tuesday 01/15/17. Voiced understanding.

## 2017-01-10 NOTE — Progress Notes (Signed)
hgb 8.5 result given to Dr Karilyn Cota. Order given to cancel blood transfusion for 01/11/17. Message left on T Valetta Fuller NP voicemail to return call.

## 2017-01-10 NOTE — Progress Notes (Signed)
Results for Berry, Alaynah B (MRN [id number]) as of 01/10/2017 13:15  Ref. Range 01/10/2017 12:32  Hemoglobin Latest Ref Range: 12.0 - 15.0 g/dL 8.5 (L)  HCT Latest Ref Range: 36.0 - 46.0 % 28.0 (L)   

## 2017-01-10 NOTE — Progress Notes (Signed)
Patient ID: [name], female   DOB: [birth year], 56 y.o.   MRN: [id number] History of Present Illness: [name] is a 56 year old white female in for well woman gyn exam,she is sp hysterectomy. She had flu and pneumonia in March and was on hospital.Had labs 4/5 and kidney function better but HGB 7.9, was referred to [name] office and saw Desiree Castle, Walsh and had labs yesterday HGB down to 7.7 and ferritin low, will get 2 units PRBC and F/u with them. Is getting chest xray too. She needs refills on meds.She baby sits grand daughter.   Current Medications, Allergies, Past Medical History, Past Surgical History, Family History and Social History were reviewed in [employer] record.     Review of Systems:  Patient denies any headaches, hearing loss, blurred vision, shortness of breath, chest pain, abdominal pain, problems with bowel movements, urination, or intercourse(none since back surgery). No joint pain or mood swings. +fatigue, weight loss, not trying, she says has gone down hill since back surgery in December 2017, but back and legs don't hurt.   Physical Exam:BP 122/82 (BP Location: Left Arm, Patient Position: Sitting, Cuff Size: Normal)   Pulse 68   Ht 5' 2"  (1.575 m)   Wt 112 lb (50.8 kg)   BMI 20.49 kg/m  General:  Well developed, well nourished, no acute distress Skin:  Warm and dry Neck:  Midline trachea, normal thyroid, good ROM, no lymphadenopathy Lungs; Clear to auscultation bilaterally Breast:  No dominant palpable mass, retraction, or nipple discharge Cardiovascular: Regular rate and rhythm Abdomen:  Soft, non tender, no hepatosplenomegaly Pelvic:  External genitalia is normal in appearance, no lesions.  The vagina is normal in appearance. Urethra has no lesions or masses. The cervix and uterus is absent. No adnexal masses or tenderness noted.Bladder is non tender, no masses felt. Rectal: Good sphincter tone, no polyps, or hemorrhoids felt.   Hemoccult negative. Extremities/musculoskeletal:  No swelling or varicosities noted, no clubbing or cyanosis Psych:  No mood changes, alert and cooperative,seems happy PHQ 2 score 0.  Impression: 1. Well woman exam with routine gynecological exam   2. Other iron deficiency anemia   3. Essential hypertension   4. Current use of estrogen therapy   5. Screening for colorectal cancer   6. Elevated serum creatinine   7. Screening for thyroid disorder   8. Weight loss, unintentional   9. Anxiety       Plan: Meds ordered this encounter  Medications  . busPIRone (BUSPAR) 5 MG tablet    Sig: Take 1 tablet (5 mg total) by mouth 2 (two) times daily.    Dispense:  180 tablet    Refill:  4    Order Specific Question:   Supervising Provider    Answer:   [name], [name] [[id number]]  . estrogens-methylTEST (ESTRATEST) 1.25-2.5 MG tablet    Sig: Take 1 tablet by mouth daily.    Dispense:  90 tablet    Refill:  3    This request is for a new prescription for a controlled substance as required by Federal/State law.    Order Specific Question:   Supervising Provider    Answer:   [name] [[id number]]  . lisinopril (PRINIVIL,ZESTRIL) 10 MG tablet    Sig: TAKE 1 TABLET (10 MG TOTAL) BY MOUTH DAILY.    Dispense:  90 tablet    Refill:  3    Order Specific Question:   Supervising Provider  Answer:   [name] [[id number]]  . simvastatin (ZOCOR) 10 MG tablet    Sig: Take 1 daily at HS    Dispense:  90 tablet    Refill:  3    Order Specific Question:   Supervising Provider    Answer:   [name] [[id number]]   Check TSH and lipids Mammogram due and yearly To get chest xray today she says  She is getting 2 Units PRBCs per T.[name] Physical in 1 year To follow up with Desiree Laural Golden

## 2017-01-10 NOTE — Progress Notes (Signed)
Results for [name], [name] (MRN [id number]) as of 01/10/2017 13:15  Ref. Range 01/10/2017 12:32  Hemoglobin Latest Ref Range: 12.0 - 15.0 g/dL 8.5 (L)  HCT Latest Ref Range: 36.0 - 46.0 % 28.0 (L)

## 2017-01-11 ENCOUNTER — Encounter (HOSPITAL_COMMUNITY): Admission: RE | Admit: 2017-01-11 | Payer: BLUE CROSS/BLUE SHIELD | Source: Ambulatory Visit

## 2017-01-14 ENCOUNTER — Telehealth (INDEPENDENT_AMBULATORY_CARE_PROVIDER_SITE_OTHER): Payer: Self-pay | Admitting: *Deleted

## 2017-01-14 NOTE — Telephone Encounter (Signed)
   Diagnosis:    Result(s)   Card 1:  Negative: 01/11/2017    Card 2: Negative: 01/12/2017   Card 3: Negative: 01/14/2017    Completed by:    HEMOCCULT SENSA DEVELOPER: JXB#:[id number]   EXPIRATION DATE: 2020-05   HEMOCCULT SENSA CARD:  NFA#:[id number]   EXPIRATION DATE: 05/20   CARD CONTROL RESULTS:  POSITIVE: Positive  NEGATIVE: Negative    ADDITIONAL COMMENTS: Forwarded to Terri for review. Patient has not been called.

## 2017-01-15 ENCOUNTER — Encounter (HOSPITAL_COMMUNITY)
Admission: RE | Admit: 2017-01-15 | Discharge: 2017-01-15 | Disposition: A | Discharge status: Home / Self Care | Payer: BLUE CROSS/BLUE SHIELD | Source: Ambulatory Visit | Attending: Internal Medicine | Admitting: Internal Medicine

## 2017-01-15 ENCOUNTER — Telehealth (INDEPENDENT_AMBULATORY_CARE_PROVIDER_SITE_OTHER): Payer: Self-pay | Admitting: Internal Medicine

## 2017-01-15 DIAGNOSIS — D508 Other iron deficiency anemias: Secondary | ICD-10-CM | POA: Diagnosis not present

## 2017-01-15 DIAGNOSIS — J189 Pneumonia, unspecified organism: Secondary | ICD-10-CM

## 2017-01-15 DIAGNOSIS — J181 Lobar pneumonia, unspecified organism: Principal | ICD-10-CM

## 2017-01-15 LAB — HEMOGLOBIN AND HEMATOCRIT, BLOOD
HEMATOCRIT: 27.6 % — AB (ref 36.0–46.0)
HEMOGLOBIN: 8.2 g/dL — AB (ref 12.0–15.0)

## 2017-01-15 NOTE — Telephone Encounter (Signed)
Cxr ordered

## 2017-01-15 NOTE — Final Progress Note (Signed)
Results for JANISSA, BERTRAM (MRN [id number]) as of 01/15/2017 15:02  Ref. Range 01/15/2017 12:04  Hemoglobin Latest Ref Range: 12.0 - 15.0 g/dL 8.2 (L)  HCT Latest Ref Range: 36.0 - 46.0 % 27.6 (L)

## 2017-01-16 ENCOUNTER — Encounter (INDEPENDENT_AMBULATORY_CARE_PROVIDER_SITE_OTHER): Payer: Self-pay | Admitting: *Deleted

## 2017-01-16 ENCOUNTER — Other Ambulatory Visit (INDEPENDENT_AMBULATORY_CARE_PROVIDER_SITE_OTHER): Payer: Self-pay | Admitting: *Deleted

## 2017-01-16 ENCOUNTER — Other Ambulatory Visit (INDEPENDENT_AMBULATORY_CARE_PROVIDER_SITE_OTHER): Payer: Self-pay | Admitting: Internal Medicine

## 2017-01-16 ENCOUNTER — Telehealth (INDEPENDENT_AMBULATORY_CARE_PROVIDER_SITE_OTHER): Payer: Self-pay | Admitting: Internal Medicine

## 2017-01-16 DIAGNOSIS — D649 Anemia, unspecified: Secondary | ICD-10-CM

## 2017-01-16 DIAGNOSIS — D508 Other iron deficiency anemias: Secondary | ICD-10-CM

## 2017-01-16 NOTE — Telephone Encounter (Signed)
EGD w biopsy sch'd 01/17/17 at 200 (100), patient aware, verbal instructions given

## 2017-01-16 NOTE — Telephone Encounter (Signed)
CBC noted for 1 week. A letter will be sent to her as a reminder.

## 2017-01-16 NOTE — Telephone Encounter (Signed)
Results have been given to patient

## 2017-01-16 NOTE — Telephone Encounter (Signed)
[name], CBC in 1 week.

## 2017-01-16 NOTE — Telephone Encounter (Signed)
[name], EGD with biopsy I have spoken with patient.

## 2017-01-17 ENCOUNTER — Encounter (HOSPITAL_COMMUNITY): Payer: Self-pay

## 2017-01-17 ENCOUNTER — Encounter (HOSPITAL_COMMUNITY)
Admission: RE | Disposition: A | Discharge status: Home / Self Care | Payer: Self-pay | Source: Ambulatory Visit | Attending: Internal Medicine

## 2017-01-17 ENCOUNTER — Ambulatory Visit (HOSPITAL_COMMUNITY)
Admission: RE | Admit: 2017-01-17 | Discharge: 2017-01-17 | Disposition: A | Discharge status: Home / Self Care | Payer: BLUE CROSS/BLUE SHIELD | Source: Ambulatory Visit | Attending: Internal Medicine | Admitting: Internal Medicine

## 2017-01-17 DIAGNOSIS — K449 Diaphragmatic hernia without obstruction or gangrene: Secondary | ICD-10-CM | POA: Insufficient documentation

## 2017-01-17 DIAGNOSIS — K219 Gastro-esophageal reflux disease without esophagitis: Secondary | ICD-10-CM | POA: Diagnosis not present

## 2017-01-17 DIAGNOSIS — R634 Abnormal weight loss: Secondary | ICD-10-CM | POA: Insufficient documentation

## 2017-01-17 DIAGNOSIS — R630 Anorexia: Secondary | ICD-10-CM | POA: Insufficient documentation

## 2017-01-17 DIAGNOSIS — K2980 Duodenitis without bleeding: Secondary | ICD-10-CM | POA: Diagnosis not present

## 2017-01-17 DIAGNOSIS — D508 Other iron deficiency anemias: Secondary | ICD-10-CM

## 2017-01-17 DIAGNOSIS — K228 Other specified diseases of esophagus: Secondary | ICD-10-CM | POA: Diagnosis not present

## 2017-01-17 DIAGNOSIS — D649 Anemia, unspecified: Secondary | ICD-10-CM | POA: Insufficient documentation

## 2017-01-17 DIAGNOSIS — K5710 Diverticulosis of small intestine without perforation or abscess without bleeding: Secondary | ICD-10-CM | POA: Insufficient documentation

## 2017-01-17 DIAGNOSIS — Z87891 Personal history of nicotine dependence: Secondary | ICD-10-CM | POA: Diagnosis not present

## 2017-01-17 DIAGNOSIS — F419 Anxiety disorder, unspecified: Secondary | ICD-10-CM | POA: Insufficient documentation

## 2017-01-17 DIAGNOSIS — M1990 Unspecified osteoarthritis, unspecified site: Secondary | ICD-10-CM | POA: Diagnosis not present

## 2017-01-17 DIAGNOSIS — D509 Iron deficiency anemia, unspecified: Secondary | ICD-10-CM | POA: Insufficient documentation

## 2017-01-17 DIAGNOSIS — Z90710 Acquired absence of both cervix and uterus: Secondary | ICD-10-CM | POA: Insufficient documentation

## 2017-01-17 DIAGNOSIS — K2970 Gastritis, unspecified, without bleeding: Secondary | ICD-10-CM | POA: Diagnosis not present

## 2017-01-17 DIAGNOSIS — K2950 Unspecified chronic gastritis without bleeding: Secondary | ICD-10-CM | POA: Diagnosis not present

## 2017-01-17 DIAGNOSIS — I10 Essential (primary) hypertension: Secondary | ICD-10-CM | POA: Insufficient documentation

## 2017-01-17 HISTORY — PX: BIOPSY: SHX5522

## 2017-01-17 HISTORY — PX: ESOPHAGOGASTRODUODENOSCOPY: SHX5428

## 2017-01-17 SURGERY — EGD (ESOPHAGOGASTRODUODENOSCOPY)
Anesthesia: Moderate Sedation

## 2017-01-17 MED ORDER — FLINTSTONES PLUS IRON PO CHEW: 1.0000 | CHEWABLE_TABLET | Freq: Two times a day (BID) | ORAL | Status: AC

## 2017-01-17 MED ORDER — SODIUM CHLORIDE 0.9 % IV SOLN
INTRAVENOUS | Status: DC
  Administered 2017-01-17: 13:00:00 via INTRAVENOUS

## 2017-01-17 MED ORDER — MEPERIDINE HCL 50 MG/ML IJ SOLN
INTRAMUSCULAR | Status: DC | PRN
  Administered 2017-01-17 (×2): 25 mg via INTRAVENOUS

## 2017-01-17 MED ORDER — MEPERIDINE HCL 50 MG/ML IJ SOLN
INTRAMUSCULAR | Status: AC
  Filled 2017-01-17: qty 1

## 2017-01-17 MED ORDER — BUTAMBEN-TETRACAINE-BENZOCAINE 2-2-14 % EX AERO
INHALATION_SPRAY | CUTANEOUS | Status: DC | PRN
  Administered 2017-01-17: 1 via TOPICAL

## 2017-01-17 MED ORDER — MIDAZOLAM HCL 5 MG/5ML IJ SOLN
INTRAMUSCULAR | Status: AC
  Filled 2017-01-17: qty 10

## 2017-01-17 MED ORDER — STERILE WATER FOR IRRIGATION IR SOLN
Status: DC | PRN
  Administered 2017-01-17: 14:00:00

## 2017-01-17 MED ORDER — MIDAZOLAM HCL 5 MG/5ML IJ SOLN
INTRAMUSCULAR | Status: DC | PRN
  Administered 2017-01-17: 2 mg via INTRAVENOUS
  Administered 2017-01-17: 1 mg via INTRAVENOUS
  Administered 2017-01-17: 2 mg via INTRAVENOUS

## 2017-01-17 NOTE — Discharge Instructions (Signed)
Resume usual medications and diet. Flintstone chewable 1 tablet twice daily. No driving for 24 hours. Physician will call with biopsy results.        Esophagogastroduodenoscopy, Care After Refer to this sheet in the next few weeks. These instructions provide you with information about caring for yourself after your procedure. Your health care provider may also give you more specific instructions. Your treatment has been planned according to current medical practices, but problems sometimes occur. Call your health care provider if you have any problems or questions after your procedure. What can I expect after the procedure? After the procedure, it is common to have:  A sore throat.  Nausea.  Bloating.  Dizziness.  Fatigue. Follow these instructions at home:  Do not eat or drink anything until the numbing medicine (local anesthetic) has worn off and your gag reflex has returned. You will know that the local anesthetic has worn off when you can swallow comfortably.  Do not drive for 24 hours if you received a medicine to help you relax (sedative).  If your health care provider took a tissue sample for testing during the procedure, make sure to get your test results. This is your responsibility. Ask your health care provider or the department performing the test when your results will be ready.  Keep all follow-up visits as told by your health care provider. This is important. Contact a health care provider if:  You cannot stop coughing.  You are not urinating.  You are urinating less than usual. Get help right away if:  You have trouble swallowing.  You cannot eat or drink.  You have throat or chest pain that gets worse.  You are dizzy or light-headed.  You faint.  You have nausea or vomiting.  You have chills.  You have a fever.  You have severe abdominal pain.  You have black, tarry, or bloody stools. This information is not intended to replace advice  given to you by your health care provider. Make sure you discuss any questions you have with your health care provider. Document Released: 09/03/2012 Document Revised: 02/23/2016 Document Reviewed: 08/11/2015 Elsevier Interactive Patient Education  2017 [employer]    Hiatal Hernia A hiatal hernia occurs when part of the stomach slides above the muscle that separates the abdomen from the chest (diaphragm). A person can be born with a hiatal hernia (congenital), or it may develop over time. In almost all cases of hiatal hernia, only the top part of the stomach pushes through the diaphragm. Many people have a hiatal hernia with no symptoms. The larger the hernia, the more likely it is that you will have symptoms. In some cases, a hiatal hernia allows stomach acid to flow back into the tube that carries food from your mouth to your stomach (esophagus). This may cause heartburn symptoms. Severe heartburn symptoms may mean that you have developed a condition called gastroesophageal reflux disease (GERD). What are the causes? This condition is caused by a weakness in the opening (hiatus) where the esophagus passes through the diaphragm to attach to the upper part of the stomach. A person may be born with a weakness in the hiatus, or a weakness can develop over time. What increases the risk? This condition is more likely to develop in:  Older people. Age is a major risk factor for a hiatal hernia, especially if you are over the age of 56.  Pregnant women.  People who are overweight.  People who have frequent constipation. What are  the signs or symptoms? Symptoms of this condition usually develop in the form of GERD symptoms. Symptoms include:  Heartburn.  Belching.  Indigestion.  Trouble swallowing.  Coughing or wheezing.  Sore throat.  Hoarseness.  Chest pain.  Nausea and vomiting. How is this diagnosed? This condition may be diagnosed during testing for GERD. Tests that may be  done include:  X-rays of your stomach or chest.  An upper gastrointestinal (GI) series. This is an X-ray exam of your GI tract that is taken after you swallow a chalky liquid that shows up clearly on the X-ray.  Endoscopy. This is a procedure to look into your stomach using a thin, flexible tube that has a tiny camera and light on the end of it. How is this treated? This condition may be treated by:  Dietary and lifestyle changes to help reduce GERD symptoms.  Medicines. These may include:  Over-the-counter antacids.  Medicines that make your stomach empty more quickly.  Medicines that block the production of stomach acid (H2 blockers).  Stronger medicines to reduce stomach acid (proton pump inhibitors).  Surgery to repair the hernia, if other treatments are not helping. If you have no symptoms, you may not need treatment. Follow these instructions at home: Lifestyle and activity   Do not use any products that contain nicotine or tobacco, such as cigarettes and e-cigarettes. If you need help quitting, ask your health care provider.  Try to achieve and maintain a healthy body weight.  Avoid putting pressure on your abdomen. Anything that puts pressure on your abdomen increases the amount of acid that may be pushed up into your esophagus.  Avoid bending over, especially after eating.  Raise the head of your bed by putting blocks under the legs. This keeps your head and esophagus higher than your stomach.  Do not wear tight clothing around your chest or stomach.  Try not to strain when having a bowel movement, when urinating, or when lifting heavy objects. Eating and drinking   Avoid foods that can worsen GERD symptoms. These may include:  Fatty foods, like fried foods.  Citrus fruits, like oranges or lemon.  Other foods and drinks that contain acid, like orange juice or tomatoes.  Spicy food.  Chocolate.  Eat frequent small meals instead of three large meals a day.  This helps prevent your stomach from getting too full.  Eat slowly.  Do not lie down right after eating.  Do not eat 1-2 hours before bed.  Do not drink beverages with caffeine. These include cola, coffee, cocoa, and tea.  Do not drink alcohol. General instructions   Take over-the-counter and prescription medicines only as told by your health care provider.  Keep all follow-up visits as told by your health care provider. This is important. Contact a health care provider if:  Your symptoms are not controlled with medicines or lifestyle changes.  You are having trouble swallowing.  You have coughing or wheezing that will not go away. Get help right away if:  Your pain is getting worse.  Your pain spreads to your arms, neck, jaw, teeth, or back.  You have shortness of breath.  You sweat for no reason.  You feel sick to your stomach (nauseous) or you vomit.  You vomit blood.  You have bright red blood in your stools.  You have black, tarry stools. This information is not intended to replace advice given to you by your health care provider. Make sure you discuss any questions you have  with your health care provider. Document Released: 12/08/2003 Document Revised: 09/10/2016 Document Reviewed: 09/10/2016 Elsevier Interactive Patient Education  2017 [employer].

## 2017-01-17 NOTE — Op Note (Signed)
[facility] Patient Name: [name] Procedure Date: 01/17/2017 1:58 PM MRN: [id number] Date of Birth: [birth year] Attending MD: Hildred Laser , MD CSN: [id number] Age: 56 Admit Type: Outpatient Procedure:                Upper GI endoscopy Indications:              Unexplained iron deficiency anemia Providers:                Hildred Laser, MD, Hinton Rao, RN, Randa Spike, Technician Referring MD:             Derrek Monaco, NP and Purvis Kilts, MD Medicines:                Cetacaine spray, Meperidine 50 mg IV, Midazolam 7                            mg IV Complications:            No immediate complications. Estimated Blood Loss:     Estimated blood loss was minimal. Procedure:                Pre-Anesthesia Assessment:                           - Prior to the procedure, a History and Physical                            was performed, and patient medications and                            allergies were reviewed. The patient's tolerance of                            previous anesthesia was also reviewed. The risks                            and benefits of the procedure and the sedation                            options and risks were discussed with the patient.                            All questions were answered, and informed consent                            was obtained. Prior Anticoagulants: The patient has                            taken no previous anticoagulant or antiplatelet                            agents. ASA Grade Assessment: II - A patient with  mild systemic disease. After reviewing the risks                            and benefits, the patient was deemed in                            satisfactory condition to undergo the procedure.                           After obtaining informed consent, the endoscope was                            passed under direct vision. Throughout the                procedure, the patient's blood pressure, pulse, and                            oxygen saturations were monitored continuously. The                            EG29-[id number] ([id number]) scope was introduced through the                            mouth, and advanced to the second part of duodenum.                            The upper GI endoscopy was accomplished without                            difficulty. The patient tolerated the procedure                            well. Scope In: 2:25:35 PM Scope Out: 2:36:50 PM Total Procedure Duration: 0 hours 11 minutes 15 seconds  Findings:      The examined esophagus was normal.      The Z-line was irregular and was found 36 cm from the incisors.      A 2 cm hiatal hernia was present.      Patchy mild inflammation characterized by congestion (edema), erythema       and granularity was found in the gastric antrum and in the prepyloric       region of the stomach. Biopsies were taken with a cold forceps for       histology. The pathology specimen was placed into Bottle Number 1.      The exam of the stomach was otherwise normal.      Patchy mild inflammation characterized by congestion (edema), erythema       and friability was found in the duodenal bulb.      A small diverticulum was found in the duodenal bulb.      The second portion of the duodenum was normal. Biopsies were taken with       a cold forceps for histology. Impression:               - Normal esophagus.                           -  Z-line irregular, 36 cm from the incisors.                           - 2 cm hiatal hernia.                           - Gastritis. Biopsied.                           - Duodenitis.                           - Duodenal diverticulum.                           - Normal second portion of the duodenum. Biopsied. Moderate Sedation:      Moderate (conscious) sedation was administered by the endoscopy nurse       and supervised by the endoscopist. The  following parameters were       monitored: oxygen saturation, heart rate, blood pressure, CO2       capnography and response to care. Total physician intraservice time was       16 minutes. Recommendation:           - Patient has a contact number available for                            emergencies. The signs and symptoms of potential                            delayed complications were discussed with the                            patient. Return to normal activities tomorrow.                            Written discharge instructions were provided to the                            patient.                           - Resume previous diet today.                           - Continue present medications.                           - Flintstone chewable with iron 1 tablet twice                            daily.                           - Await pathology results. Procedure Code(s):        --- Professional ---  67341, Esophagogastroduodenoscopy, flexible,                            transoral; with biopsy, single or multiple                           99152, Moderate sedation services provided by the                            same physician or other qualified health care                            professional performing the diagnostic or                            therapeutic service that the sedation supports,                            requiring the presence of an independent trained                            observer to assist in the monitoring of the                            patient's level of consciousness and physiological                            status; initial 15 minutes of intraservice time,                            patient age 19 years or older Diagnosis Code(s):        --- Professional ---                           K22.8, Other specified diseases of esophagus                           K44.9, Diaphragmatic hernia without obstruction or                             gangrene                           K29.70, Gastritis, unspecified, without bleeding                           K29.80, Duodenitis without bleeding                           D50.9, Iron deficiency anemia, unspecified                           K57.10, Diverticulosis of small intestine without                            perforation or abscess without bleeding CPT copyright  2016 American Medical Association. All rights reserved. The codes documented in this report are preliminary and upon coder review may  be revised to meet current compliance requirements. Hildred Laser, MD Hildred Laser, MD 01/17/2017 2:47:57 PM This report has been signed electronically. Number of Addenda: 0

## 2017-01-17 NOTE — H&P (Signed)
[name] is an 56 y.o. female.   Chief Complaint: Patient is here for EGD. HPI: Patient is 56 year old Caucasian female was recently found to have iron deficiency anemia. She denies melena or rectal bleeding. She had hysterectomy 7 years ago. Multiple stool guaiacs have been negative. She has chronic GERD and takes ranitidine usually 5 times a week. She says it works. She denies dysphagia or abdominal pain. She also denies diarrhea. She does not have a good appetite. She is been gradually losing weight. Over a to 45 years she has lost close to 50 pounds. She does not take OTC and set. She does not drink alcohol. She has not smoked in over 10 years. She is not vegetarian. She used to donate blood regularly but not in the last 3 years. Her hemoglobin was low then. She had essentially normal colonoscopy in April 2015. Was done for screening purposes. Family History is negative for CRC or celiac disease.  Past Medical History:  Diagnosis Date  . Anxiety   . Back pain of lumbosacaral region with sciatica 01/03/2016  . Complication of anesthesia   . Current use of estrogen therapy 12/02/2014  . DJD (degenerative joint disease), lumbosacral 01/11/2016  . Dysuria 01/19/2014  . Fibrocystic breast changes 01/11/2016  . GERD (gastroesophageal reflux disease)    occ  . H/O estrogen therapy   . Hypertension     Past Surgical History:  Procedure Laterality Date  . APPENDECTOMY  2007   had with bso  . BACK SURGERY     L4 & L5  . BILATERAL SALPINGOOPHORECTOMY  2007  . CARPAL TUNNEL RELEASE Right    has had 3x  . CESAREAN SECTION    . COLONOSCOPY N/A 01/13/2014   Procedure: COLONOSCOPY;  Surgeon: [name], MD;  Location: [facility];  Service: Endoscopy;  Laterality: N/A;  930  . NECK SURGERY  12/2010  . TOTAL ABDOMINAL HYSTERECTOMY  07/2005    for menorrhagia and ut. fibroids    Family History  Problem Relation Age of Onset  . Parkinsonism Father   . Alzheimer's disease Father   .  Heart attack Brother   . Diabetes Sister     borderline  . Cancer Sister     kidney  . Hypertension Sister   . COPD Mother   . Hypertension Brother   . Colon cancer Neg Hx    Social History:  reports that she quit smoking about 20 years ago. Her smoking use included Cigarettes. She has a 1.25 pack-year smoking history. She has never used smokeless tobacco. She reports that she does not drink alcohol or use drugs.  Allergies:  Allergies  Allergen Reactions  . No Known Allergies     Medications Prior to Admission  Medication Sig Dispense Refill  . busPIRone (BUSPAR) 5 MG tablet Take 1 tablet (5 mg total) by mouth 2 (two) times daily. 180 tablet 4  . estrogens-methylTEST (ESTRATEST) 1.25-2.5 MG tablet Take 1 tablet by mouth daily. 90 tablet 3  . lisinopril (PRINIVIL,ZESTRIL) 10 MG tablet TAKE 1 TABLET (10 MG TOTAL) BY MOUTH DAILY. 90 tablet 3  . ranitidine (ZANTAC) 150 MG tablet Take 150 mg by mouth daily as needed for heartburn.     . simvastatin (ZOCOR) 10 MG tablet Take 1 daily at HS (Patient taking differently: Take 10 mg by mouth at bedtime. Take 1 daily at HS) 90 tablet 3    No results found for this or any previous visit (from  the past 48 hour(s)). No results found.  ROS  Blood pressure 127/64, pulse (!) 56, temperature 97.8 F (36.6 C), temperature source Oral, resp. rate 17, SpO2 100 %. Physical Exam  Constitutional: She is oriented to person, place, and time.  Well-developed thin Caucasian female in NAD.  HENT:  Mouth/Throat: Oropharynx is clear and moist.  Eyes: Conjunctivae are normal. No scleral icterus.  Neck: No thyromegaly present.  Cardiovascular: Normal rate, regular rhythm and normal heart sounds.   No murmur heard. Respiratory: Effort normal and breath sounds normal.  GI: Soft. She exhibits no distension and no mass. There is no tenderness.  Musculoskeletal: She exhibits no edema.  Lymphadenopathy:    She has no cervical adenopathy.  Neurological: She  is alert and oriented to person, place, and time.  Skin: Skin is warm and dry.     Assessment/Plan Iron deficiency anemia. Anorexia and weight loss. Diagnostic EGD.  Hildred Laser, MD 01/17/2017, 2:13 PM

## 2017-01-22 ENCOUNTER — Other Ambulatory Visit (INDEPENDENT_AMBULATORY_CARE_PROVIDER_SITE_OTHER): Payer: Self-pay | Admitting: *Deleted

## 2017-01-22 ENCOUNTER — Encounter (HOSPITAL_COMMUNITY): Payer: Self-pay | Admitting: Internal Medicine

## 2017-01-22 DIAGNOSIS — R634 Abnormal weight loss: Secondary | ICD-10-CM

## 2017-01-22 DIAGNOSIS — D508 Other iron deficiency anemias: Secondary | ICD-10-CM

## 2017-01-23 ENCOUNTER — Other Ambulatory Visit (INDEPENDENT_AMBULATORY_CARE_PROVIDER_SITE_OTHER): Payer: Self-pay | Admitting: *Deleted

## 2017-01-23 LAB — HEMOGLOBIN AND HEMATOCRIT, BLOOD
HEMATOCRIT: 27.5 % — AB (ref 35.0–45.0)
Hemoglobin: 7.9 g/dL — ABNORMAL LOW (ref 11.7–15.5)

## 2017-01-29 LAB — CELIAC PNL 2 RFLX ENDOMYSIAL AB TTR
(TTG) AB, IGG: 3 U/mL
Endomysial Ab IgA: POSITIVE — AB
Gliadin(Deam) Ab,IgA: >100 U — ABNORMAL HIGH (ref <20)
Gliadin(Deam) Ab,IgG: 20 U — ABNORMAL HIGH (ref <20)
IMMUNOGLOBULIN A: 283 mg/dL (ref 81–463)

## 2017-01-29 LAB — RFLX ENDOMYSIAL AB TITER

## 2017-01-31 ENCOUNTER — Other Ambulatory Visit (INDEPENDENT_AMBULATORY_CARE_PROVIDER_SITE_OTHER): Payer: Self-pay | Admitting: Internal Medicine

## 2017-01-31 DIAGNOSIS — K900 Celiac disease: Secondary | ICD-10-CM

## 2017-02-04 ENCOUNTER — Other Ambulatory Visit (INDEPENDENT_AMBULATORY_CARE_PROVIDER_SITE_OTHER): Payer: Self-pay | Admitting: *Deleted

## 2017-02-04 DIAGNOSIS — D649 Anemia, unspecified: Secondary | ICD-10-CM

## 2017-02-04 LAB — HEMOGLOBIN AND HEMATOCRIT, BLOOD
HEMATOCRIT: 28.4 % — AB (ref 35.0–45.0)
HEMOGLOBIN: 8.2 g/dL — AB (ref 11.7–15.5)

## 2017-02-05 NOTE — Progress Notes (Signed)
Sent [facility] a message to open a spot for this patient for one month.

## 2017-02-06 ENCOUNTER — Encounter (INDEPENDENT_AMBULATORY_CARE_PROVIDER_SITE_OTHER): Payer: Self-pay | Admitting: Internal Medicine

## 2017-02-13 ENCOUNTER — Other Ambulatory Visit: Payer: Self-pay | Admitting: Adult Health

## 2017-02-13 DIAGNOSIS — Z1231 Encounter for screening mammogram for malignant neoplasm of breast: Secondary | ICD-10-CM

## 2017-02-14 ENCOUNTER — Ambulatory Visit (HOSPITAL_COMMUNITY)
Admission: RE | Admit: 2017-02-14 | Discharge: 2017-02-14 | Disposition: A | Discharge status: Home / Self Care | Payer: BLUE CROSS/BLUE SHIELD | Source: Ambulatory Visit | Attending: Adult Health | Admitting: Adult Health

## 2017-02-14 DIAGNOSIS — Z1231 Encounter for screening mammogram for malignant neoplasm of breast: Secondary | ICD-10-CM

## 2017-02-15 LAB — TSH: TSH: 0.647 u[IU]/mL (ref 0.450–4.500)

## 2017-02-15 LAB — LIPID PANEL
CHOL/HDL RATIO: 4 ratio (ref 0.0–4.4)
CHOLESTEROL TOTAL: 79 mg/dL — AB (ref 100–199)
HDL: 20 mg/dL — ABNORMAL LOW (ref >39)
LDL CALC: 42 mg/dL (ref 0–99)
TRIGLYCERIDES: 86 mg/dL (ref 0–149)
VLDL Cholesterol Cal: 17 mg/dL (ref 5–40)

## 2017-02-18 ENCOUNTER — Other Ambulatory Visit: Payer: Self-pay | Admitting: Adult Health

## 2017-02-18 DIAGNOSIS — R928 Other abnormal and inconclusive findings on diagnostic imaging of breast: Secondary | ICD-10-CM

## 2017-02-19 ENCOUNTER — Telehealth: Payer: Self-pay | Admitting: Adult Health

## 2017-02-19 ENCOUNTER — Ambulatory Visit (HOSPITAL_COMMUNITY)
Admission: RE | Admit: 2017-02-19 | Discharge: 2017-02-19 | Disposition: A | Discharge status: Home / Self Care | Payer: BLUE CROSS/BLUE SHIELD | Source: Ambulatory Visit | Attending: Adult Health | Admitting: Adult Health

## 2017-02-19 DIAGNOSIS — R928 Other abnormal and inconclusive findings on diagnostic imaging of breast: Secondary | ICD-10-CM

## 2017-02-19 NOTE — Telephone Encounter (Signed)
Left message of results, know having to have breast rechecked, call me back later.

## 2017-02-21 ENCOUNTER — Encounter: Payer: BLUE CROSS/BLUE SHIELD | Attending: Family Medicine | Admitting: Nutrition

## 2017-02-21 VITALS — Ht 65.0 in | Wt 105.6 lb

## 2017-02-21 DIAGNOSIS — Z713 Dietary counseling and surveillance: Secondary | ICD-10-CM | POA: Diagnosis present

## 2017-02-21 DIAGNOSIS — K2100 Gastro-esophageal reflux disease with esophagitis, without bleeding: Secondary | ICD-10-CM

## 2017-02-21 DIAGNOSIS — K900 Celiac disease: Secondary | ICD-10-CM | POA: Insufficient documentation

## 2017-02-21 NOTE — Progress Notes (Signed)
Medical Nutrition Therapy:  Appt start time: 0800 end time:  0900.   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  She is here with her husband today. Just diagnosed about a month ago by DR. Rehman. Has lost about 15 lbs in the last 2-3 months.  UBW  120-130's. She ntoes she had been been anemic and losing blood long time and that is what initiated the EGD. Hasn't had an appetite for 2-3 months. Has stomach cramping, no diarrhea, no constipation, no bloating,  Indigestion, Heartburn. Postive for tired, weak, thirst some, no appetite.. Craves ice.    Just started MVI with iron Flinstones with the last month. Stools are darker now with taking iron.    PMH: HTN, Hyperlipidemia.    Usually only eating 1-2 meals per day.  Not picky. Diet is insuffient to meet her nutritional needs for needed weight gain and overall health.   Lab Results  Component Value Date   HGBA1C 5.7 (H) 01/03/2016   CMP Latest Ref Rng & Units 01/03/2017 12/20/2016 12/19/2016  Glucose 65 - 99 mg/dL 74 55(L) 87  BUN 6 - 24 mg/dL 10 14 19   Creatinine 0.57 - 1.00 mg/dL 0.95 1.10(H) 1.47(H)  Sodium 134 - 144 mmol/L 137 134(L) 127(L)  Potassium 3.5 - 5.2 mmol/L 5.2 5.0 4.6  Chloride 96 - 106 mmol/L 99 108 96(L)  CO2 18 - 29 mmol/L 24 20(L) 22  Calcium 8.7 - 10.2 mg/dL 8.4(L) 7.4(L) 8.0(L)  Total Protein 6.0 - 8.5 g/dL 6.1 - 6.0(L)  Total Bilirubin 0.0 - 1.2 mg/dL 0.3 - 0.5  Alkaline Phos 39 - 117 IU/L 55 - 46  AST 0 - 40 IU/L 30 - 37  ALT 0 - 32 IU/L 29 - 30   CBC    Component Value Date/Time   WBC 7.1 01/09/2017 0934   RBC 3.48 (L) 01/09/2017 0934   HGB 8.2 (L) 02/04/2017 0847   HCT 28.4 (L) 02/04/2017 0847   HCT 27.5 (L) 01/03/2017 0822   PLT 506 (H) 01/09/2017 0934   PLT 758 (H) 01/03/2017 0822   MCV 77.3 (L) 01/09/2017 0934   MCV 80 01/03/2017 0822   MCH 22.1 (L) 01/09/2017 0934   MCHC 28.6 (L) 01/09/2017 0934   RDW 19.7 (H) 01/09/2017 0934   RDW 20.1 (H) 01/03/2017 0822   LYMPHSABS 2,414 01/09/2017  0934   MONOABS 568 01/09/2017 0934   EOSABS 142 01/09/2017 0934   BASOSABS 71 01/09/2017 0934     Preferred Learning Style:   No preference indicated   Learning Readiness:   Ready  Change in progress   MEDICATIONS:    DIETARY INTAKE:   24-hr recall:  B ( AM): skips  Snk ( AM): water  L ( PM): skips Snk ( PM): D ( PM):BBQ beef, chips, pie-chocolate, water Snk ( PM): Beverages: Water, sweet tea  Usual physical activity: ADL  Estimated energy needs: 1800  calories 200 g carbohydrates 135 g protein 50 g fat  Progress Towards Goal(s):  In progress.   Nutritional Diagnosis:  NI-5.4 Decreased nutrient needs (specify): Gluten As related to Celiac.  As evidenced by EGD results, and GI issues.     Intervention:  Celiac and Gluten Free Diet, reading food labels, Diet for reflux and foods to avoid. My Plate and importance of balanced meals and nutrient needs for weight gain and improved overall nutrition. Iron rich foods.  Teaching Method Utilized:  Visual Auditory Hands on  Handouts given during visit include:  The  Plate Method  Nutrition Therapy for Celiac Disease  Barriers to learning/adherence to lifestyle change: none  Demonstrated degree of understanding via:  Teach Back   Monitoring/Evaluation:  Dietary intake, exercise, meal planning,  and body weight in 1 month(s).

## 2017-02-21 NOTE — Patient Instructions (Addendum)
Goals 1. Follow Gluten Free Diet 2. Read labels 3. Obtain Gluten Products for meals 4. Will try to eat small frequent meals 5. Gain 2 lbs per month Check with pharmacist about gluten free MVI. Avoid acidic and spicy foods Ask Dr. Karilyn Cotaehman about taking daily anti reflux meds vs just PRN. Increase iron rich foods.

## 2017-03-07 ENCOUNTER — Telehealth: Payer: Self-pay | Admitting: Nutrition

## 2017-03-07 NOTE — Telephone Encounter (Signed)
VM to call and reschedule appt 

## 2017-03-14 ENCOUNTER — Encounter (INDEPENDENT_AMBULATORY_CARE_PROVIDER_SITE_OTHER): Payer: Self-pay | Admitting: Internal Medicine

## 2017-03-14 ENCOUNTER — Ambulatory Visit (INDEPENDENT_AMBULATORY_CARE_PROVIDER_SITE_OTHER): Payer: BLUE CROSS/BLUE SHIELD | Admitting: Internal Medicine

## 2017-03-14 VITALS — BP 100/66 | HR 70 | Temp 98.2°F | Resp 18 | Ht 66.0 in | Wt 106.4 lb

## 2017-03-14 DIAGNOSIS — K900 Celiac disease: Secondary | ICD-10-CM

## 2017-03-14 DIAGNOSIS — R634 Abnormal weight loss: Secondary | ICD-10-CM

## 2017-03-14 DIAGNOSIS — D508 Other iron deficiency anemias: Secondary | ICD-10-CM

## 2017-03-14 NOTE — Patient Instructions (Addendum)
Try liquid iron at a small dose as discussed (OTC). B complex 1 tablet by mouth daily Physician will call with results of blood tests. If hemoglobin remains low will consider iron infusion.

## 2017-03-14 NOTE — Progress Notes (Addendum)
Presenting complaint;  Follow-up for iron deficiency anemia and recent diagnosis of celiac disease.  Database and Subjective:  Patient is 56 year old Caucasian female who was evaluated were 2 months ago for iron deficiency anemia. There was no history of GI blood loss or vaginal bleeding. Her stool was guaiac negative. She also reported poor appetite and gradual weight loss of 50 pounds over her of 4-5 years. She stated heartburn was well controlled with ranitidine. She underwent EGD about 8 weeks ago. He had gastritis. Biopsies were negative for H. pylori. She also bulbar duodenitis. Random biopsies from second by the duodenum revealed changes of celiac disease. Celiac antibody panel was obtained and she was referred to dietitian.  Patient states she has been on gluten-free diet. She feels she is very compliant. She has been on this diet for 3 weeks. She does not feel any better. She also does not feel any worse. She remains with poor appetite. She states she has gained 1 pound since she went on gluten-free diet. She has no energy. She has nausea at times after meals but denies vomiting. She has been taking Flintstone chewable vitamin with iron but she has "stomach pain". She is having 2-3 bowel movements per day. Almost all of her stools are loose. She denies melena or rectal bleeding. She also denies fever chills or night sweats. She does not drink alcohol and has not smoked in over 10 years. She does not feel depressed.    Current Medications: Outpatient Encounter Prescriptions as of 03/14/2017  Medication Sig  . busPIRone (BUSPAR) 5 MG tablet Take 1 tablet (5 mg total) by mouth 2 (two) times daily.  Marland Kitchen estrogens-methylTEST (ESTRATEST) 1.25-2.5 MG tablet Take 1 tablet by mouth daily.  Marland Kitchen lisinopril (PRINIVIL,ZESTRIL) 10 MG tablet TAKE 1 TABLET (10 MG TOTAL) BY MOUTH DAILY.  Marland Kitchen Pediatric Multivitamins-Iron (FLINTSTONES PLUS IRON) chewable tablet Chew 1 tablet by mouth 2 (two) times daily.  .  ranitidine (ZANTAC) 150 MG tablet Take 150 mg by mouth daily as needed for heartburn.   . simvastatin (ZOCOR) 10 MG tablet Take 1 daily at HS (Patient taking differently: Take 10 mg by mouth at bedtime. Take 1 daily at HS)   No facility-administered encounter medications on file as of 03/14/2017.      Objective: Blood pressure 100/66, pulse 70, temperature 98.2 F (36.8 C), temperature source Oral, resp. rate 18, height 5' 6"  (1.676 m), weight 106 lb 6.4 oz (48.3 kg). Patient is alert and in no acute distress. She is thin. Conjunctiva is pink. Sclera is nonicteric Oropharyngeal mucosa is normal. No neck masses or thyromegaly noted. Cardiac exam with regular rhythm normal S1 and S2. No murmur or gallop noted. Lungs are clear to auscultation. Abdomen is flat soft and nontender without organomegaly or masses. No LE edema or clubbing noted.  Labs/studies Results:  H&H was 7.9 and 27.5 on 01/22/2017  H&H was 8.2 and 28.4 on 02/04/2017.    Assessment:  #1. Iron deficiency anemia. IDA felt to be secondary to celiac disease. She is on low-dose by mouth iron which she is not tolerating well. She may require iron infusion.  #2. Celiac disease. She has been on gluten-free diet for 3 weeks but without symptomatic improvement. She did have celiac antibody panel which I believe revealed abnormal result but result cannot be found in epic.   Plan:  Patient will try liquid iron and start with small dose and gradually increase it as tolerated. She will start no more than 10-15 mg  of iron daily. Patient will go to the lab for CBC, serum albumin and zinc level. Patient also advised to take 1 B complex tablet(OTC) daily. Further recommendations to follow. OV in 3 months.

## 2017-03-15 LAB — CBC
HCT: 34.1 % — ABNORMAL LOW (ref 35.0–45.0)
Hemoglobin: 10.2 g/dL — ABNORMAL LOW (ref 11.7–15.5)
MCH: 24.2 pg — AB (ref 27.0–33.0)
MCHC: 29.9 g/dL — AB (ref 32.0–36.0)
MCV: 81 fL (ref 80.0–100.0)
MPV: 9.3 fL (ref 7.5–12.5)
PLATELETS: 386 10*3/uL (ref 140–400)
RBC: 4.21 MIL/uL (ref 3.80–5.10)
RDW: 21.8 % — ABNORMAL HIGH (ref 11.0–15.0)
WBC: 8.5 10*3/uL (ref 3.8–10.8)

## 2017-03-15 LAB — ALBUMIN: Albumin: 3.8 g/dL (ref 3.6–5.1)

## 2017-03-16 LAB — ZINC: ZINC: 56 ug/dL — AB (ref 60–130)

## 2017-03-28 ENCOUNTER — Ambulatory Visit: Payer: BLUE CROSS/BLUE SHIELD | Admitting: Nutrition

## 2017-04-09 ENCOUNTER — Encounter: Payer: BLUE CROSS/BLUE SHIELD | Attending: Family Medicine | Admitting: Nutrition

## 2017-04-09 VITALS — Ht 65.0 in | Wt 108.0 lb

## 2017-04-09 DIAGNOSIS — K900 Celiac disease: Secondary | ICD-10-CM

## 2017-04-09 DIAGNOSIS — R636 Underweight: Secondary | ICD-10-CM

## 2017-04-09 NOTE — Patient Instructions (Signed)
Goals 1. Continue read labels for gluten. 2. Increase vegetables with lunch and dinner 3. Add fruit or bread/cereal with breakfast Gain 2 lbs per month

## 2017-04-09 NOTE — Progress Notes (Signed)
Medical Nutrition Therapy:  Appt start time: 0800 end time:  0900.   Assessment:  Primary concerns today:  Celieac disease follow up.  Changes made: buying more gluten free foods now. Gained 2 lbs.  Feels like she is eating more and feeling better over all. Has more energy.  No complaints of stomach/GI  issues. BM regular. Eating three meals per day. Taking MVI daily. No diarrhea. Is reading labels for gluten free products.  Diet is improving. Diet still needs more calories, protein, vegetables and fruit.   Scheduled to see Dr. Karilyn Cotaehman for follow up in next few months. IBW 120-125 lbs.    Lab Results  Component Value Date   HGBA1C 5.7 (H) 01/03/2016   CMP Latest Ref Rng & Units 01/03/2017 12/20/2016 12/19/2016  Glucose 65 - 99 mg/dL 74 56(O55(L) 87  BUN 6 - 24 mg/dL 10 14 19   Creatinine 0.57 - 1.00 mg/dL 1.300.95 8.65(H1.10(H) 8.46(N1.47(H)  Sodium 134 - 144 mmol/L 137 134(L) 127(L)  Potassium 3.5 - 5.2 mmol/L 5.2 5.0 4.6  Chloride 96 - 106 mmol/L 99 108 96(L)  CO2 18 - 29 mmol/L 24 20(L) 22  Calcium 8.7 - 10.2 mg/dL 6.2(X8.4(L) 7.4(L) 8.0(L)  Total Protein 6.0 - 8.5 g/dL 6.1 - 6.0(L)  Total Bilirubin 0.0 - 1.2 mg/dL 0.3 - 0.5  Alkaline Phos 39 - 117 IU/L 55 - 46  AST 0 - 40 IU/L 30 - 37  ALT 0 - 32 IU/L 29 - 30   CBC    Component Value Date/Time   WBC 8.5 03/14/2017 1651   RBC 4.21 03/14/2017 1651   HGB 10.2 (L) 03/14/2017 1651   HGB 7.9 (L) 01/03/2017 0822   HCT 34.1 (L) 03/14/2017 1651   HCT 27.5 (L) 01/03/2017 0822   PLT 386 03/14/2017 1651   PLT 758 (H) 01/03/2017 0822   MCV 81.0 03/14/2017 1651   MCV 80 01/03/2017 0822   MCH 24.2 (L) 03/14/2017 1651   MCHC 29.9 (L) 03/14/2017 1651   RDW 21.8 (H) 03/14/2017 1651   RDW 20.1 (H) 01/03/2017 0822   LYMPHSABS 2,414 01/09/2017 0934   MONOABS 568 01/09/2017 0934   EOSABS 142 01/09/2017 0934   BASOSABS 71 01/09/2017 0934     Preferred Learning Style:   No preference indicated   Learning Readiness:   Ready  Change in  progress   MEDICATIONS:    DIETARY INTAKE:   24-hr recall:  B ( AM): 2 eggs Snk ( AM): water  L ( PM): PB gluten fres sandwich, Snk ( PM): D ( PM): PB gluten free sandwich Snk ( PM): Beverages: Water,  Usual physical activity: ADL  Estimated energy needs: 1800  calories 200 g carbohydrates 135 g protein 50 g fat  Progress Towards Goal(s):  In progress.   Nutritional Diagnosis:  NI-5.4 Decreased nutrient needs (specify): Gluten As related to Celiac.  As evidenced by EGD results, and GI issues.     Intervention:  Celiac and Gluten Free Diet, reading food labels, Diet for reflux and foods to avoid. My Plate and importance of balanced meals and nutrient needs for weight gain and improved overall nutrition. Iron rich foods. Reading foods lables.  Goals 1. Continue read labels for gluten. 2. Increase vegetables with lunch and dinner 3. Add fruit or bread/cereal with breakfast Gain 2 lbs per month  Teaching Method Utilized:  Visual Auditory Hands on  Handouts given during visit include:  The Plate Method  Nutrition Therapy for Celiac Disease  Barriers to  learning/adherence to lifestyle change: none  Demonstrated degree of understanding via:  Teach Back   Monitoring/Evaluation:  Dietary intake, exercise, meal planning,  and body weight in 6 month(s).

## 2017-05-03 ENCOUNTER — Other Ambulatory Visit (INDEPENDENT_AMBULATORY_CARE_PROVIDER_SITE_OTHER): Payer: Self-pay | Admitting: *Deleted

## 2017-05-03 ENCOUNTER — Encounter (INDEPENDENT_AMBULATORY_CARE_PROVIDER_SITE_OTHER): Payer: Self-pay | Admitting: Internal Medicine

## 2017-05-03 ENCOUNTER — Encounter (INDEPENDENT_AMBULATORY_CARE_PROVIDER_SITE_OTHER): Payer: Self-pay | Admitting: *Deleted

## 2017-05-03 DIAGNOSIS — D649 Anemia, unspecified: Secondary | ICD-10-CM

## 2017-05-21 LAB — CBC
HEMATOCRIT: 35.2 % (ref 35.0–45.0)
HEMOGLOBIN: 11.1 g/dL — AB (ref 11.7–15.5)
MCH: 28 pg (ref 27.0–33.0)
MCHC: 31.5 g/dL — ABNORMAL LOW (ref 32.0–36.0)
MCV: 88.7 fL (ref 80.0–100.0)
MPV: 10 fL (ref 7.5–12.5)
Platelets: 302 10*3/uL (ref 140–400)
RBC: 3.97 MIL/uL (ref 3.80–5.10)
RDW: 19.3 % — AB (ref 11.0–15.0)
WBC: 8.2 10*3/uL (ref 3.8–10.8)

## 2017-05-23 ENCOUNTER — Other Ambulatory Visit (INDEPENDENT_AMBULATORY_CARE_PROVIDER_SITE_OTHER): Payer: Self-pay | Admitting: *Deleted

## 2017-05-23 ENCOUNTER — Encounter (INDEPENDENT_AMBULATORY_CARE_PROVIDER_SITE_OTHER): Payer: Self-pay

## 2017-05-23 ENCOUNTER — Encounter (INDEPENDENT_AMBULATORY_CARE_PROVIDER_SITE_OTHER): Payer: Self-pay | Admitting: Internal Medicine

## 2017-05-23 ENCOUNTER — Ambulatory Visit (INDEPENDENT_AMBULATORY_CARE_PROVIDER_SITE_OTHER): Payer: BLUE CROSS/BLUE SHIELD | Admitting: Internal Medicine

## 2017-05-23 VITALS — BP 90/60 | HR 76 | Temp 97.3°F | Ht 66.0 in | Wt 109.0 lb

## 2017-05-23 DIAGNOSIS — K900 Celiac disease: Secondary | ICD-10-CM | POA: Diagnosis not present

## 2017-05-23 NOTE — Progress Notes (Signed)
Subjective:    Patient ID: [name], female    DOB: 1961-09-28, 56 y.o.   MRN: [id number]  HPI  Here today for f/u. Underwent an EGD in April of this year which revealed:      a cold forceps for histology. Impression:               - Normal esophagus.                           - Z-line irregular, 36 cm from the incisors.                           - 2 cm hiatal hernia.                           - Gastritis. Biopsied.                           - Duodenitis.                           - Duodenal diverticulum.                           - Normal second portion of the duodenum. Biopsied.  Duodenal biopsy consistent with celiac disease. Celiac panel positive Has been evaluated by Pola Corn, Registered dietician.  She tell me she is doing good. Her appetite is good. She is following a gluten free diet.  She is having a BM daily. No melena or BRRB. She is trying to exercising. She watches her grand baby Eating 2 meals a day and is trying to get her calories in.  CBC    Component Value Date/Time   WBC 8.2 05/21/2017 0823   RBC 3.97 05/21/2017 0823   HGB 11.1 (L) 05/21/2017 0823   HGB 7.9 (L) 01/03/2017 0822   HCT 35.2 05/21/2017 0823   HCT 27.5 (L) 01/03/2017 0822   PLT 302 05/21/2017 0823   PLT 758 (H) 01/03/2017 0822   MCV 88.7 05/21/2017 0823   MCV 80 01/03/2017 0822   MCH 28.0 05/21/2017 0823   MCHC 31.5 (L) 05/21/2017 0823   RDW 19.3 (H) 05/21/2017 0823   RDW 20.1 (H) 01/03/2017 0822   LYMPHSABS 2,414 01/09/2017 0934   MONOABS 568 01/09/2017 0934   EOSABS 142 01/09/2017 0934   BASOSABS 71 01/09/2017 0934     Review of Systems Past Medical History:  Diagnosis Date  . Anxiety   . Back pain of lumbosacaral region with sciatica 01/03/2016  . Complication of anesthesia   . Current use of estrogen therapy 12/02/2014  . DJD (degenerative joint disease), lumbosacral 01/11/2016  . Dysuria 01/19/2014  . Fibrocystic breast changes 01/11/2016  . GERD (gastroesophageal  reflux disease)    occ  . H/O estrogen therapy   . Hypertension     Past Surgical History:  Procedure Laterality Date  . APPENDECTOMY  2007   had with bso  . BACK SURGERY     L4 & L5  . BILATERAL SALPINGOOPHORECTOMY  2007  . BIOPSY N/A 01/17/2017   Procedure: BIOPSY;  Surgeon: Malissa Hippo, MD;  Location: [facility];  Service: Endoscopy;  Laterality: N/A;  . CARPAL TUNNEL RELEASE Right    has had  3x  . CESAREAN SECTION    . COLONOSCOPY N/A 01/13/2014   Procedure: COLONOSCOPY;  Surgeon: Malissa Hippo, MD;  Location: [facility];  Service: Endoscopy;  Laterality: N/A;  930  . ESOPHAGOGASTRODUODENOSCOPY N/A 01/17/2017   Procedure: ESOPHAGOGASTRODUODENOSCOPY (EGD);  Surgeon: Malissa Hippo, MD;  Location: [facility];  Service: Endoscopy;  Laterality: N/A;  2:10  . NECK SURGERY  12/2010  . TOTAL ABDOMINAL HYSTERECTOMY  07/2005    for menorrhagia and ut. fibroids    Allergies  Allergen Reactions  . No Known Allergies     Current Outpatient Prescriptions on File Prior to Visit  Medication Sig Dispense Refill  . busPIRone (BUSPAR) 5 MG tablet Take 1 tablet (5 mg total) by mouth 2 (two) times daily. 180 tablet 4  . estrogens-methylTEST (ESTRATEST) 1.25-2.5 MG tablet Take 1 tablet by mouth daily. 90 tablet 3  . lisinopril (PRINIVIL,ZESTRIL) 10 MG tablet TAKE 1 TABLET (10 MG TOTAL) BY MOUTH DAILY. 90 tablet 3  . Pediatric Multivitamins-Iron (FLINTSTONES PLUS IRON) chewable tablet Chew 1 tablet by mouth 2 (two) times daily.    . ranitidine (ZANTAC) 150 MG tablet Take 150 mg by mouth daily as needed for heartburn.     . simvastatin (ZOCOR) 10 MG tablet Take 1 daily at HS (Patient taking differently: Take 10 mg by mouth at bedtime. Take 1 daily at HS) 90 tablet 3   No current facility-administered medications on file prior to visit.         Objective:   Physical Exam Blood pressure 90/60, pulse 76, temperature (!) 97.3 F (36.3 C), height 5\' 6"  (1.676 m), weight 109 lb  (49.4 kg). Alert and oriented. Skin warm and dry. Oral mucosa is moist.   . Sclera anicteric, conjunctivae is pink. Thyroid not enlarged. No cervical lymphadenopathy. Lungs clear. Heart regular rate and rhythm.  Abdomen is soft. Bowel sounds are positive. No hepatomegaly. No abdominal masses felt. No tenderness.  No edema to lower extremities.           Assessment & Plan:  Celiac disease. She is following a Celiac diet. She seems to be doing well. OV in 6 months.

## 2017-05-23 NOTE — Patient Instructions (Signed)
CBC in 4 month. OV in 1 year

## 2017-07-23 ENCOUNTER — Other Ambulatory Visit: Payer: Self-pay | Admitting: Adult Health

## 2017-08-26 ENCOUNTER — Other Ambulatory Visit (INDEPENDENT_AMBULATORY_CARE_PROVIDER_SITE_OTHER): Payer: Self-pay | Admitting: *Deleted

## 2017-08-26 ENCOUNTER — Encounter (INDEPENDENT_AMBULATORY_CARE_PROVIDER_SITE_OTHER): Payer: Self-pay | Admitting: *Deleted

## 2017-08-26 DIAGNOSIS — K900 Celiac disease: Secondary | ICD-10-CM

## 2017-09-20 LAB — CBC
HEMATOCRIT: 30.9 % — AB (ref 35.0–45.0)
HEMOGLOBIN: 9.7 g/dL — AB (ref 11.7–15.5)
MCH: 27.6 pg (ref 27.0–33.0)
MCHC: 31.4 g/dL — AB (ref 32.0–36.0)
MCV: 87.8 fL (ref 80.0–100.0)
MPV: 10.3 fL (ref 7.5–12.5)
Platelets: 328 10*3/uL (ref 140–400)
RBC: 3.52 10*6/uL — ABNORMAL LOW (ref 3.80–5.10)
RDW: 14.6 % (ref 11.0–15.0)
WBC: 7.5 10*3/uL (ref 3.8–10.8)

## 2017-09-25 ENCOUNTER — Other Ambulatory Visit (INDEPENDENT_AMBULATORY_CARE_PROVIDER_SITE_OTHER): Payer: Self-pay | Admitting: *Deleted

## 2017-09-25 DIAGNOSIS — K900 Celiac disease: Secondary | ICD-10-CM

## 2017-09-27 ENCOUNTER — Encounter (INDEPENDENT_AMBULATORY_CARE_PROVIDER_SITE_OTHER): Payer: Self-pay | Admitting: *Deleted

## 2017-09-27 ENCOUNTER — Telehealth (INDEPENDENT_AMBULATORY_CARE_PROVIDER_SITE_OTHER): Payer: Self-pay | Admitting: *Deleted

## 2017-09-27 DIAGNOSIS — K900 Celiac disease: Secondary | ICD-10-CM

## 2017-09-27 NOTE — Telephone Encounter (Signed)
Lab order was completed. 

## 2017-10-15 ENCOUNTER — Encounter: Payer: BLUE CROSS/BLUE SHIELD | Attending: Family Medicine | Admitting: Nutrition

## 2017-10-15 VITALS — Wt 113.0 lb

## 2017-10-15 DIAGNOSIS — K900 Celiac disease: Secondary | ICD-10-CM

## 2017-10-15 DIAGNOSIS — D508 Other iron deficiency anemias: Secondary | ICD-10-CM

## 2017-10-15 NOTE — Progress Notes (Signed)
Medical Nutrition Therapy:  Appt start time: 1000 end time:  1015  Assessment:  Primary concerns today:  Celieac disease follow up.  Changes made: Continues to have a well stocked pantry of gluten free foods. Gained   4 lbs for a total of 6 lbs since first visit.  Feels like she is eating more and feeling better over all. Has more energy.  She is now more anemic-H/H  9.7/7.9.  Can't tolerate iron pills; hurts her stomach. Craves and eats ice all day long. Stays cold for this reason. Cravings of ice due to anemia.  Willing to work on improving iron content in her diet to see if it can improve her anemia. Denies black stools, blood in stools. EGD came back with no ulcers. Taking MVI daily. IBW 120-125 lbs.    Lab Results  Component Value Date   HGBA1C 5.7 (H) 01/03/2016   CMP Latest Ref Rng & Units 01/03/2017 12/20/2016 12/19/2016  Glucose 65 - 99 mg/dL 74 55(L) 87  BUN 6 - 24 mg/dL 10 14 19   Creatinine 0.57 - 1.00 mg/dL 0.95 1.10(H) 1.47(H)  Sodium 134 - 144 mmol/L 137 134(L) 127(L)  Potassium 3.5 - 5.2 mmol/L 5.2 5.0 4.6  Chloride 96 - 106 mmol/L 99 108 96(L)  CO2 18 - 29 mmol/L 24 20(L) 22  Calcium 8.7 - 10.2 mg/dL 8.4(L) 7.4(L) 8.0(L)  Total Protein 6.0 - 8.5 g/dL 6.1 - 6.0(L)  Total Bilirubin 0.0 - 1.2 mg/dL 0.3 - 0.5  Alkaline Phos 39 - 117 IU/L 55 - 46  AST 0 - 40 IU/L 30 - 37  ALT 0 - 32 IU/L 29 - 30   CBC    Component Value Date/Time   WBC 7.5 09/20/2017 0909   RBC 3.52 (L) 09/20/2017 0909   HGB 9.7 (L) 09/20/2017 0909   HGB 7.9 (L) 01/03/2017 0822   HCT 30.9 (L) 09/20/2017 0909   HCT 27.5 (L) 01/03/2017 0822   PLT 328 09/20/2017 0909   PLT 758 (H) 01/03/2017 0822   MCV 87.8 09/20/2017 0909   MCV 80 01/03/2017 0822   MCH 27.6 09/20/2017 0909   MCHC 31.4 (L) 09/20/2017 0909   RDW 14.6 09/20/2017 0909   RDW 20.1 (H) 01/03/2017 0822   LYMPHSABS 2,414 01/09/2017 0934   MONOABS 568 01/09/2017 0934   EOSABS 142 01/09/2017 0934   BASOSABS 71 01/09/2017 0934      Preferred Learning Style:   No preference indicated   Learning Readiness:   Ready  Change in progress   MEDICATIONS:    DIETARY INTAKE:   24-hr recall:  B ( AM): 2 eggs Snk ( AM): water  L ( PM): PB gluten fres sandwich, Snk ( PM): D ( PM): PB gluten free sandwich Snk ( PM): Beverages: Water,  Usual physical activity: ADL  Estimated energy needs: 1800  calories 200 g carbohydrates 135 g protein 50 g fat  Progress Towards Goal(s):  In progress.   Nutritional Diagnosis:  NI-5.4 Decreased nutrient needs (specify): Gluten As related to Celiac.  As evidenced by EGD results, and GI issues.     Intervention:  Celiac and Gluten Free Diet, reading food labels, Diet for reflux and foods to avoid. My Plate and importance of balanced meals and nutrient needs for weight gain and improved overall nutrition. Iron rich foods. Reading foods lables. Iron rich foods.  Goals Increase iron rich foods from handouts. Try increase spinach, bran flakes, raisins, beef, apricots,  Whole wheat bread Try Polyvisol if  [place] with MD for extra iron if tolerated.  Teaching Method Utilized:  Visual Auditory Hands on  Handouts given during visit include:  The Plate Method  Nutrition Therapy for Celiac Disease  Barriers to learning/adherence to lifestyle change: none  Demonstrated degree of understanding via:  Teach Back   Monitoring/Evaluation:  Dietary intake, exercise, meal planning,  and body weight in PRN.

## 2017-10-15 NOTE — Patient Instructions (Addendum)
Goals Increase iron rich foods from handouts. Try increase spinach, bran flakes, raisins, beef, apricots,  Whole wheat bread Try Polyvisol if Ok with MD for extra iron if tolerated.

## 2017-10-23 LAB — CBC WITH DIFFERENTIAL/PLATELET
BASOS ABS: 80 {cells}/uL (ref 0–200)
Basophils Relative: 0.9 %
EOS ABS: 231 {cells}/uL (ref 15–500)
Eosinophils Relative: 2.6 %
HEMATOCRIT: 31.3 % — AB (ref 35.0–45.0)
HEMOGLOBIN: 10 g/dL — AB (ref 11.7–15.5)
LYMPHS ABS: 2234 {cells}/uL (ref 850–3900)
MCH: 26.5 pg — AB (ref 27.0–33.0)
MCHC: 31.9 g/dL — AB (ref 32.0–36.0)
MCV: 82.8 fL (ref 80.0–100.0)
MONOS PCT: 10.2 %
MPV: 10.4 fL (ref 7.5–12.5)
NEUTROS ABS: 5447 {cells}/uL (ref 1500–7800)
Neutrophils Relative %: 61.2 %
Platelets: 337 10*3/uL (ref 140–400)
RBC: 3.78 10*6/uL — ABNORMAL LOW (ref 3.80–5.10)
RDW: 14.9 % (ref 11.0–15.0)
Total Lymphocyte: 25.1 %
WBC: 8.9 10*3/uL (ref 3.8–10.8)
WBCMIX: 908 {cells}/uL (ref 200–950)

## 2017-10-28 ENCOUNTER — Telehealth (INDEPENDENT_AMBULATORY_CARE_PROVIDER_SITE_OTHER): Payer: Self-pay | Admitting: Internal Medicine

## 2017-10-28 ENCOUNTER — Telehealth (INDEPENDENT_AMBULATORY_CARE_PROVIDER_SITE_OTHER): Payer: Self-pay | Admitting: *Deleted

## 2017-10-28 DIAGNOSIS — K219 Gastro-esophageal reflux disease without esophagitis: Secondary | ICD-10-CM

## 2017-10-28 MED ORDER — OMEPRAZOLE 40 MG PO CPDR: 40.0000 mg | DELAYED_RELEASE_CAPSULE | Freq: Every day | ORAL | 3 refills | Status: DC

## 2017-10-28 NOTE — Telephone Encounter (Signed)
Patient called left message stating Dr Karilyn Cotaehman told her she has a high hernia, patient states she is burning from her throat all the way down. Patient is requesting something to be called in. Please advise [phone]

## 2017-10-28 NOTE — Telephone Encounter (Signed)
Rx for Omeprazole 40mg  sent to her pharmacy. She can take the Zantac at night as needed

## 2017-10-28 NOTE — Telephone Encounter (Signed)
Rx for Omeprazole sent to her phamacy

## 2018-01-02 ENCOUNTER — Other Ambulatory Visit: Payer: Self-pay | Admitting: Adult Health

## 2018-01-09 ENCOUNTER — Other Ambulatory Visit: Payer: Self-pay | Admitting: Adult Health

## 2018-01-09 DIAGNOSIS — Z1231 Encounter for screening mammogram for malignant neoplasm of breast: Secondary | ICD-10-CM

## 2018-01-16 ENCOUNTER — Encounter: Payer: Self-pay | Admitting: Adult Health

## 2018-01-16 ENCOUNTER — Ambulatory Visit: Payer: BLUE CROSS/BLUE SHIELD | Admitting: Adult Health

## 2018-01-16 VITALS — BP 160/90 | HR 99 | Ht 65.0 in | Wt 117.0 lb

## 2018-01-16 DIAGNOSIS — I10 Essential (primary) hypertension: Secondary | ICD-10-CM

## 2018-01-16 DIAGNOSIS — E7800 Pure hypercholesterolemia, unspecified: Secondary | ICD-10-CM

## 2018-01-16 DIAGNOSIS — Z1212 Encounter for screening for malignant neoplasm of rectum: Secondary | ICD-10-CM

## 2018-01-16 DIAGNOSIS — Z1211 Encounter for screening for malignant neoplasm of colon: Secondary | ICD-10-CM | POA: Insufficient documentation

## 2018-01-16 DIAGNOSIS — Z01419 Encounter for gynecological examination (general) (routine) without abnormal findings: Secondary | ICD-10-CM

## 2018-01-16 DIAGNOSIS — Z79899 Other long term (current) drug therapy: Secondary | ICD-10-CM | POA: Diagnosis not present

## 2018-01-16 DIAGNOSIS — F419 Anxiety disorder, unspecified: Secondary | ICD-10-CM

## 2018-01-16 DIAGNOSIS — D509 Iron deficiency anemia, unspecified: Secondary | ICD-10-CM | POA: Diagnosis not present

## 2018-01-16 DIAGNOSIS — Z01411 Encounter for gynecological examination (general) (routine) with abnormal findings: Secondary | ICD-10-CM

## 2018-01-16 LAB — HEMOCCULT GUIAC POC 1CARD (OFFICE): Fecal Occult Blood, POC: NEGATIVE

## 2018-01-16 MED ORDER — LISINOPRIL 10 MG PO TABS: ORAL_TABLET | ORAL | 3 refills | Status: DC

## 2018-01-16 MED ORDER — EST ESTROGENS-METHYLTEST 1.25-2.5 MG PO TABS
1.0000 | ORAL_TABLET | Freq: Every day | ORAL | 1 refills | Status: DC
Start: 2018-01-16 — End: 2018-07-22

## 2018-01-16 MED ORDER — SIMVASTATIN 10 MG PO TABS: ORAL_TABLET | ORAL | 3 refills | Status: DC

## 2018-01-16 MED ORDER — BUSPIRONE HCL 5 MG PO TABS: 5.0000 mg | ORAL_TABLET | Freq: Two times a day (BID) | ORAL | 4 refills | Status: DC

## 2018-01-16 NOTE — Progress Notes (Signed)
Patient ID: [name], female   DOB: 05/29/1961, 57 y.o.   MRN: [id number] History of Present Illness: [name] is a 57 year old white female, married, sp hysterectomy in for a well woman gyn exam. PCP is [name], and she sees [name].   Current Medications, Allergies, Past Medical History, Past Surgical History, Family History and Social History were reviewed in [name] Link electronic medical record.     Review of Systems: Patient denies any headaches, hearing loss, fatigue, blurred vision, shortness of breath, chest pain, abdominal pain, problems with bowel movements, urination, or intercourse. No joint pain or mood swings. Left leg hurts at times, has had back surgery in the past, told her to call [name].     Physical Exam:BP (!) 160/90 (BP Location: Left Arm, Cuff Size: Normal)   Pulse 99   Ht 5\' 5"  (1.651 m)   Wt 117 lb (53.1 kg)   BMI 19.47 kg/m  General:  Well developed, well nourished, no acute distress Skin:  Warm and dry Neck:  Midline trachea, normal thyroid, good ROM, no lymphadenopathy Lungs; Clear to auscultation bilaterally Breast:  No dominant palpable mass, retraction, or nipple discharge Cardiovascular: Regular rate and rhythm Abdomen:  Soft, non tender, no hepatosplenomegaly Pelvic:  External genitalia is normal in appearance, no lesions.  The vagina is normal in appearance,has good color and moisture. Urethra has no lesions or masses. The cervix and uterus are absent. No adnexal masses or tenderness noted.Bladder is non tender, no masses felt. Rectal: Good sphincter tone, no polyps, or hemorrhoids felt.  Hemoccult negative. Extremities/musculoskeletal:  No swelling or varicosities noted, no clubbing or cyanosis Psych:  No mood changes, alert and cooperative,seems happy PHQ 9 score 5, is on meds, doing good.  Impression:  1. Well woman exam with routine gynecological exam   2. Essential hypertension   3. Current use of estrogen therapy   4.  Screening for colorectal cancer   5. Anxiety   6. Iron deficiency anemia, unspecified iron deficiency anemia type   7. Elevated cholesterol      Plan: Check CBC,CMP,TSH and lipids fasting in May Get pneumonia shot Mammogram yearly Colonoscopy per GI Meds ordered this encounter  Medications  . busPIRone (BUSPAR) 5 MG tablet    Sig: Take 1 tablet (5 mg total) by mouth 2 (two) times daily.    Dispense:  180 tablet    Refill:  4    Order Specific Question:   Supervising Provider    Answer:   Despina HiddenEURE, LUTHER H [[id number]]  . estrogens-methylTEST (ESTRATEST) 1.25-2.5 MG tablet    Sig: Take 1 tablet by mouth daily.    Dispense:  90 tablet    Refill:  1    This request is for a new prescription for a controlled substance as required by Federal/State law.    Order Specific Question:   Supervising Provider    Answer:   Duane LopeEURE, LUTHER H [[id number]]  . simvastatin (ZOCOR) 10 MG tablet    Sig: TAKE 1 TABLET BY MOUTH AT BEDTIME    Dispense:  90 tablet    Refill:  3    Order Specific Question:   Supervising Provider    Answer:   Despina HiddenEURE, LUTHER H [[id number]]  . lisinopril (PRINIVIL,ZESTRIL) 10 MG tablet    Sig: TAKE 1 TABLET (10 MG TOTAL) BY MOUTH DAILY.    Dispense:  90 tablet    Refill:  3    Order Specific Question:   Supervising Provider  Answer:   Duane Lope H [[id number]]

## 2018-02-19 LAB — COMPREHENSIVE METABOLIC PANEL
ALBUMIN: 4.1 g/dL (ref 3.5–5.5)
ALT: 17 IU/L (ref 0–32)
AST: 21 IU/L (ref 0–40)
Albumin/Globulin Ratio: 1.5 (ref 1.2–2.2)
Alkaline Phosphatase: 70 IU/L (ref 39–117)
BILIRUBIN TOTAL: 0.4 mg/dL (ref 0.0–1.2)
BUN / CREAT RATIO: 8 — AB (ref 9–23)
BUN: 9 mg/dL (ref 6–24)
CHLORIDE: 97 mmol/L (ref 96–106)
CO2: 21 mmol/L (ref 20–29)
CREATININE: 1.1 mg/dL — AB (ref 0.57–1.00)
Calcium: 8.9 mg/dL (ref 8.7–10.2)
GFR calc Af Amer: 64 mL/min/{1.73_m2} (ref >59)
GFR calc non Af Amer: 56 mL/min/{1.73_m2} — ABNORMAL LOW (ref >59)
GLOBULIN, TOTAL: 2.7 g/dL (ref 1.5–4.5)
Glucose: 105 mg/dL — ABNORMAL HIGH (ref 65–99)
POTASSIUM: 5.4 mmol/L — AB (ref 3.5–5.2)
SODIUM: 134 mmol/L (ref 134–144)
Total Protein: 6.8 g/dL (ref 6.0–8.5)

## 2018-02-19 LAB — CBC
HEMATOCRIT: 32.5 % — AB (ref 34.0–46.6)
HEMOGLOBIN: 9.3 g/dL — AB (ref 11.1–15.9)
MCH: 22.6 pg — AB (ref 26.6–33.0)
MCHC: 28.6 g/dL — AB (ref 31.5–35.7)
MCV: 79 fL (ref 79–97)
Platelets: 329 10*3/uL (ref 150–450)
RBC: 4.12 x10E6/uL (ref 3.77–5.28)
RDW: 17.8 % — AB (ref 12.3–15.4)
WBC: 7.4 10*3/uL (ref 3.4–10.8)

## 2018-02-19 LAB — TSH: TSH: 0.754 u[IU]/mL (ref 0.450–4.500)

## 2018-02-19 LAB — LIPID PANEL
CHOLESTEROL TOTAL: 124 mg/dL (ref 100–199)
Chol/HDL Ratio: 4.6 ratio — ABNORMAL HIGH (ref 0.0–4.4)
HDL: 27 mg/dL — ABNORMAL LOW (ref >39)
LDL Calculated: 81 mg/dL (ref 0–99)
Triglycerides: 80 mg/dL (ref 0–149)
VLDL CHOLESTEROL CAL: 16 mg/dL (ref 5–40)

## 2018-02-20 ENCOUNTER — Telehealth: Payer: Self-pay | Admitting: Adult Health

## 2018-02-20 NOTE — Telephone Encounter (Signed)
Pt aware of labs, and that HGB has dropped, she needs to F/U with Dr Karilyn Cota, will route copy to him and she will call his office.

## 2018-02-27 ENCOUNTER — Ambulatory Visit (HOSPITAL_COMMUNITY)
Admission: RE | Admit: 2018-02-27 | Discharge: 2018-02-27 | Disposition: A | Discharge status: Home / Self Care | Payer: BLUE CROSS/BLUE SHIELD | Source: Ambulatory Visit | Attending: Adult Health | Admitting: Adult Health

## 2018-02-27 ENCOUNTER — Encounter (HOSPITAL_COMMUNITY): Payer: Self-pay

## 2018-02-27 DIAGNOSIS — Z1231 Encounter for screening mammogram for malignant neoplasm of breast: Secondary | ICD-10-CM | POA: Diagnosis present

## 2018-05-22 ENCOUNTER — Ambulatory Visit (INDEPENDENT_AMBULATORY_CARE_PROVIDER_SITE_OTHER): Payer: BLUE CROSS/BLUE SHIELD | Admitting: Internal Medicine

## 2018-05-22 ENCOUNTER — Encounter (INDEPENDENT_AMBULATORY_CARE_PROVIDER_SITE_OTHER): Payer: Self-pay | Admitting: Internal Medicine

## 2018-05-22 VITALS — BP 180/80 | HR 60 | Temp 97.8°F | Ht 66.0 in | Wt 116.4 lb

## 2018-05-22 DIAGNOSIS — K900 Celiac disease: Secondary | ICD-10-CM

## 2018-05-22 DIAGNOSIS — D508 Other iron deficiency anemias: Secondary | ICD-10-CM | POA: Diagnosis not present

## 2018-05-22 DIAGNOSIS — K219 Gastro-esophageal reflux disease without esophagitis: Secondary | ICD-10-CM

## 2018-05-22 LAB — HEMOGLOBIN AND HEMATOCRIT, BLOOD
HCT: 28.8 % — ABNORMAL LOW (ref 35.0–45.0)
Hemoglobin: 8.4 g/dL — ABNORMAL LOW (ref 11.7–15.5)

## 2018-05-22 MED ORDER — OMEPRAZOLE 40 MG PO CPDR: 40.0000 mg | DELAYED_RELEASE_CAPSULE | Freq: Every day | ORAL | 3 refills | Status: DC

## 2018-05-22 NOTE — Progress Notes (Addendum)
Subjective:    Patient ID: [name], female    DOB: 11-29-1960, 57 y.o.   MRN: [id number]  HPI Here today for f/u. Last seen in August of 2018. Hx of Celiac disease.  Has been evaluated by Vicenta Dunning, Registered dietician. She tells me she is doing good. She has gained 3 1/2 pounds since her last visit.  She is following a celiac diet. Has a BM daily. No melena or BRRB.  She keeps her grandchildren during the day. Her last colonoscopy was in 2015 (screening): normal except for internal./external hemorrhoids. 01/16/2018 stool card negative.    EGD in April 2018 revealed: a cold forceps for histology. Impression: - Normal esophagus. - Z-line irregular, 36 cm from the incisors. - 2 cm hiatal hernia. - Gastritis. Biopsied. - Duodenitis. - Duodenal diverticulum. - Normal second portion of the duodenum. Biopsied.  Duodenal biopsy consistent with celiac disease. Celiac panel positive    CBC    Component Value Date/Time   WBC 7.4 02/18/2018 0831   WBC 8.9 10/23/2017 0836   RBC 4.12 02/18/2018 0831   RBC 3.78 (L) 10/23/2017 0836   HGB 9.3 (L) 02/18/2018 0831   HCT 32.5 (L) 02/18/2018 0831   PLT 329 02/18/2018 0831   MCV 79 02/18/2018 0831   MCH 22.6 (L) 02/18/2018 0831   MCH 26.5 (L) 10/23/2017 0836   MCHC 28.6 (L) 02/18/2018 0831   MCHC 31.9 (L) 10/23/2017 0836   RDW 17.8 (H) 02/18/2018 0831   LYMPHSABS 2,234 10/23/2017 0836   MONOABS 568 01/09/2017 0934   EOSABS 231 10/23/2017 0836   BASOSABS 80 10/23/2017 0836     Review of Systems Past Medical History:  Diagnosis Date  . Anxiety   . Back pain of lumbosacaral region with sciatica 01/03/2016  . Celiac disease   . Complication of anesthesia   . Current use of estrogen therapy 12/02/2014  . DJD (degenerative joint disease), lumbosacral 01/11/2016   . Dysuria 01/19/2014  . Fibrocystic breast changes 01/11/2016  . GERD (gastroesophageal reflux disease)    occ  . H/O estrogen therapy   . Hypertension     Past Surgical History:  Procedure Laterality Date  . APPENDECTOMY  2007   had with bso  . BACK SURGERY     L4 & L5  . BILATERAL SALPINGOOPHORECTOMY  2007  . BIOPSY N/A 01/17/2017   Procedure: BIOPSY;  Surgeon: Malissa Hippo, MD;  Location: [facility];  Service: Endoscopy;  Laterality: N/A;  . CARPAL TUNNEL RELEASE Right    has had 3x  . CESAREAN SECTION    . COLONOSCOPY N/A 01/13/2014   Procedure: COLONOSCOPY;  Surgeon: Malissa Hippo, MD;  [facility]: [facility];  Service: Endoscopy;  Laterality: N/A;  930  . ESOPHAGOGASTRODUODENOSCOPY N/A 01/17/2017   Procedure: ESOPHAGOGASTRODUODENOSCOPY (EGD);  Surgeon: Malissa Hippo, MD;  [facility]: [facility];  Service: Endoscopy;  Laterality: N/A;  2:10  . NECK SURGERY  12/2010  . TOTAL ABDOMINAL HYSTERECTOMY  07/2005    for menorrhagia and ut. fibroids    Allergies  Allergen Reactions  . No Known Allergies     Current Outpatient Medications on File Prior to Visit  Medication Sig Dispense Refill  . busPIRone (BUSPAR) 5 MG tablet Take 1 tablet (5 mg total) by mouth 2 (two) times daily. 180 tablet 4  . estrogens-methylTEST (ESTRATEST) 1.25-2.5 MG tablet Take 1 tablet by mouth daily. 90 tablet 1  . lisinopril (PRINIVIL,ZESTRIL) 10 MG tablet TAKE 1 TABLET (  10 MG TOTAL) BY MOUTH DAILY. 90 tablet 3  . omeprazole (PRILOSEC) 40 MG capsule Take 1 capsule (40 mg total) by mouth daily. 90 capsule 3  . Pediatric Multivitamins-Iron (FLINTSTONES PLUS IRON) chewable tablet Chew 1 tablet by mouth 2 (two) times daily.    . ranitidine (ZANTAC) 150 MG tablet Take 150 mg by mouth daily as needed for heartburn.     . simvastatin (ZOCOR) 10 MG tablet TAKE 1 TABLET BY MOUTH AT BEDTIME 90 tablet 3   No current facility-administered medications on file prior to visit.         Objective:    Physical Exam Blood pressure (!) 180/80, pulse 60, temperature 97.8 F (36.6 C), height 5\' 6"  (1.676 m), weight 116 lb 6.4 oz (52.8 kg). Alert and oriented. Skin warm and dry. Oral mucosa is moist.   . Sclera anicteric, conjunctivae is pink. Thyroid not enlarged. No cervical lymphadenopathy. Lungs clear. Heart regular rate and rhythm.  Abdomen is soft. Bowel sounds are positive. No hepatomegaly. No abdominal masses felt. No tenderness.  No edema to lower extremities.          Assessment & Plan:  Celiac disease. She will continue her diet. IDA: 3 stools cards home with patient. H and H today

## 2018-05-22 NOTE — Patient Instructions (Addendum)
OV in 1 year.  H and H today

## 2018-05-26 ENCOUNTER — Other Ambulatory Visit (INDEPENDENT_AMBULATORY_CARE_PROVIDER_SITE_OTHER): Payer: Self-pay | Admitting: *Deleted

## 2018-05-26 ENCOUNTER — Encounter (INDEPENDENT_AMBULATORY_CARE_PROVIDER_SITE_OTHER): Payer: Self-pay | Admitting: *Deleted

## 2018-05-26 DIAGNOSIS — D649 Anemia, unspecified: Secondary | ICD-10-CM

## 2018-05-29 ENCOUNTER — Telehealth (INDEPENDENT_AMBULATORY_CARE_PROVIDER_SITE_OTHER): Payer: Self-pay | Admitting: *Deleted

## 2018-05-29 ENCOUNTER — Telehealth (INDEPENDENT_AMBULATORY_CARE_PROVIDER_SITE_OTHER): Payer: Self-pay | Admitting: Internal Medicine

## 2018-05-29 DIAGNOSIS — D508 Other iron deficiency anemias: Secondary | ICD-10-CM

## 2018-05-29 NOTE — Telephone Encounter (Signed)
   Diagnosis:    Result(s)                                                                                  Stools were brown in color.  Card 1: Negative:                                                          Card 2:Negative:   Card 3:Negative:    Completed by: Psalm Schappell,LPN   HEMOCCULT SENSA DEVELOPER: LOT#:  [id number] EXPIRATION DATE: 2021-11   HEMOCCULT SENSA CARD:  LOT#:  [id number] EXPIRATION DATE: 08/21   CARD CONTROL RESULTS:  POSITIVE: Positive NEGATIVE: Negative    ADDITIONAL COMMENTS: Results were forwarded to Delrae Renderri Setzer,NP.

## 2018-05-29 NOTE — Telephone Encounter (Signed)
Results given to patient. Will get iron studies.

## 2018-05-29 NOTE — Telephone Encounter (Signed)
Iron studies ordered 

## 2018-06-04 LAB — IRON,TIBC AND FERRITIN PANEL
%SAT: 4 % — AB (ref 16–45)
FERRITIN: 9 ng/mL — AB (ref 16–232)
Iron: 13 ug/dL — ABNORMAL LOW (ref 45–160)
TIBC: 346 mcg/dL (calc) (ref 250–450)

## 2018-06-04 LAB — B12 AND FOLATE PANEL
Folate: 12.8 ng/mL
VITAMIN B 12: 488 pg/mL (ref 200–1100)

## 2018-06-17 ENCOUNTER — Telehealth (INDEPENDENT_AMBULATORY_CARE_PROVIDER_SITE_OTHER): Payer: Self-pay

## 2018-06-17 ENCOUNTER — Telehealth (INDEPENDENT_AMBULATORY_CARE_PROVIDER_SITE_OTHER): Payer: Self-pay | Admitting: Internal Medicine

## 2018-06-17 ENCOUNTER — Other Ambulatory Visit (INDEPENDENT_AMBULATORY_CARE_PROVIDER_SITE_OTHER): Payer: Self-pay

## 2018-06-17 LAB — HEMOGLOBIN AND HEMATOCRIT, BLOOD
HCT: 29.1 % — ABNORMAL LOW (ref 35.0–45.0)
Hemoglobin: 8.4 g/dL — ABNORMAL LOW (ref 11.7–15.5)

## 2018-06-17 NOTE — Telephone Encounter (Signed)
[name], H and H in 2 weeks

## 2018-06-17 NOTE — Telephone Encounter (Signed)
Called  [employer]  To see if pre Berkley Harveyauth was need for an Iron transfusion ,Pre Karen ChafeSheila E. w/ [employer] no Berkley Harveyauth is req. Ref  #[id number]. Faxed to Short Stay.

## 2018-06-19 ENCOUNTER — Encounter (HOSPITAL_COMMUNITY): Payer: Self-pay

## 2018-06-19 ENCOUNTER — Encounter (HOSPITAL_COMMUNITY)
Admission: RE | Admit: 2018-06-19 | Discharge: 2018-06-19 | Disposition: A | Discharge status: Home / Self Care | Payer: BLUE CROSS/BLUE SHIELD | Source: Ambulatory Visit | Attending: Internal Medicine | Admitting: Internal Medicine

## 2018-06-19 DIAGNOSIS — D649 Anemia, unspecified: Secondary | ICD-10-CM | POA: Insufficient documentation

## 2018-06-19 MED ORDER — SODIUM CHLORIDE 0.9 % IV SOLN
Freq: Once | INTRAVENOUS | Status: AC
  Administered 2018-06-19: 12:00:00 via INTRAVENOUS

## 2018-06-19 MED ORDER — SODIUM CHLORIDE 0.9 % IV SOLN
510.0000 mg | Freq: Once | INTRAVENOUS | Status: AC
  Administered 2018-06-19: 510 mg via INTRAVENOUS
  Filled 2018-06-19: qty 17

## 2018-06-19 NOTE — Discharge Instructions (Signed)

## 2018-06-19 NOTE — Progress Notes (Signed)
Teaching completed regarding Feraheme infusion.  Written documents provided and patient verbalizes understanding.

## 2018-06-25 ENCOUNTER — Other Ambulatory Visit (INDEPENDENT_AMBULATORY_CARE_PROVIDER_SITE_OTHER): Payer: Self-pay | Admitting: *Deleted

## 2018-06-25 ENCOUNTER — Encounter (INDEPENDENT_AMBULATORY_CARE_PROVIDER_SITE_OTHER): Payer: Self-pay | Admitting: *Deleted

## 2018-06-25 DIAGNOSIS — D649 Anemia, unspecified: Secondary | ICD-10-CM

## 2018-06-25 NOTE — Telephone Encounter (Signed)
Labs are noted and a letter will be sent to.  The  patient as a reminder.

## 2018-07-09 LAB — HEMOGLOBIN AND HEMATOCRIT, BLOOD
HEMATOCRIT: 35.8 % (ref 35.0–45.0)
Hemoglobin: 10.7 g/dL — ABNORMAL LOW (ref 11.7–15.5)

## 2018-07-20 IMAGING — DX DG CHEST 2V
2 series · 2 of 2 positions shown · non-contrast
Comparison: Chest x-ray of December 19, 2016

CLINICAL DATA: Community-acquired pneumonia

EXAM:
CHEST  2 VIEW

[chest pa]
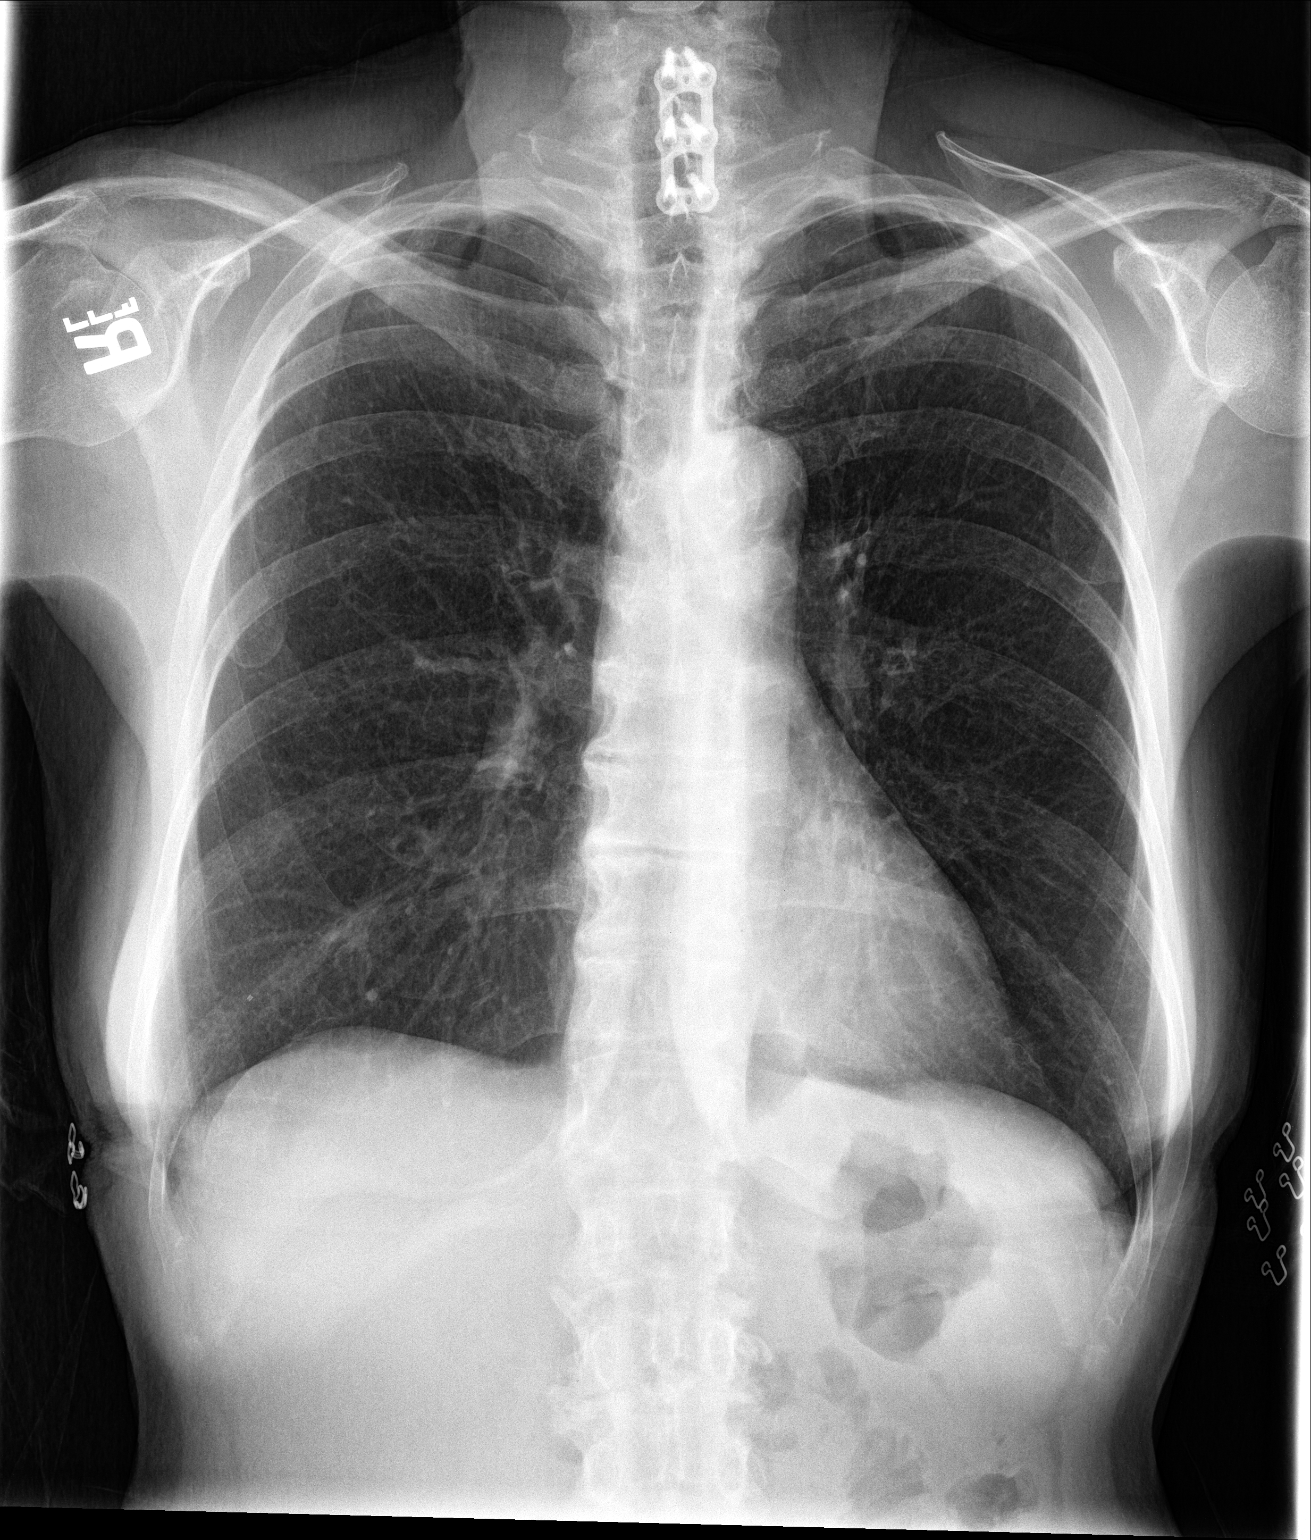

[chest lat]
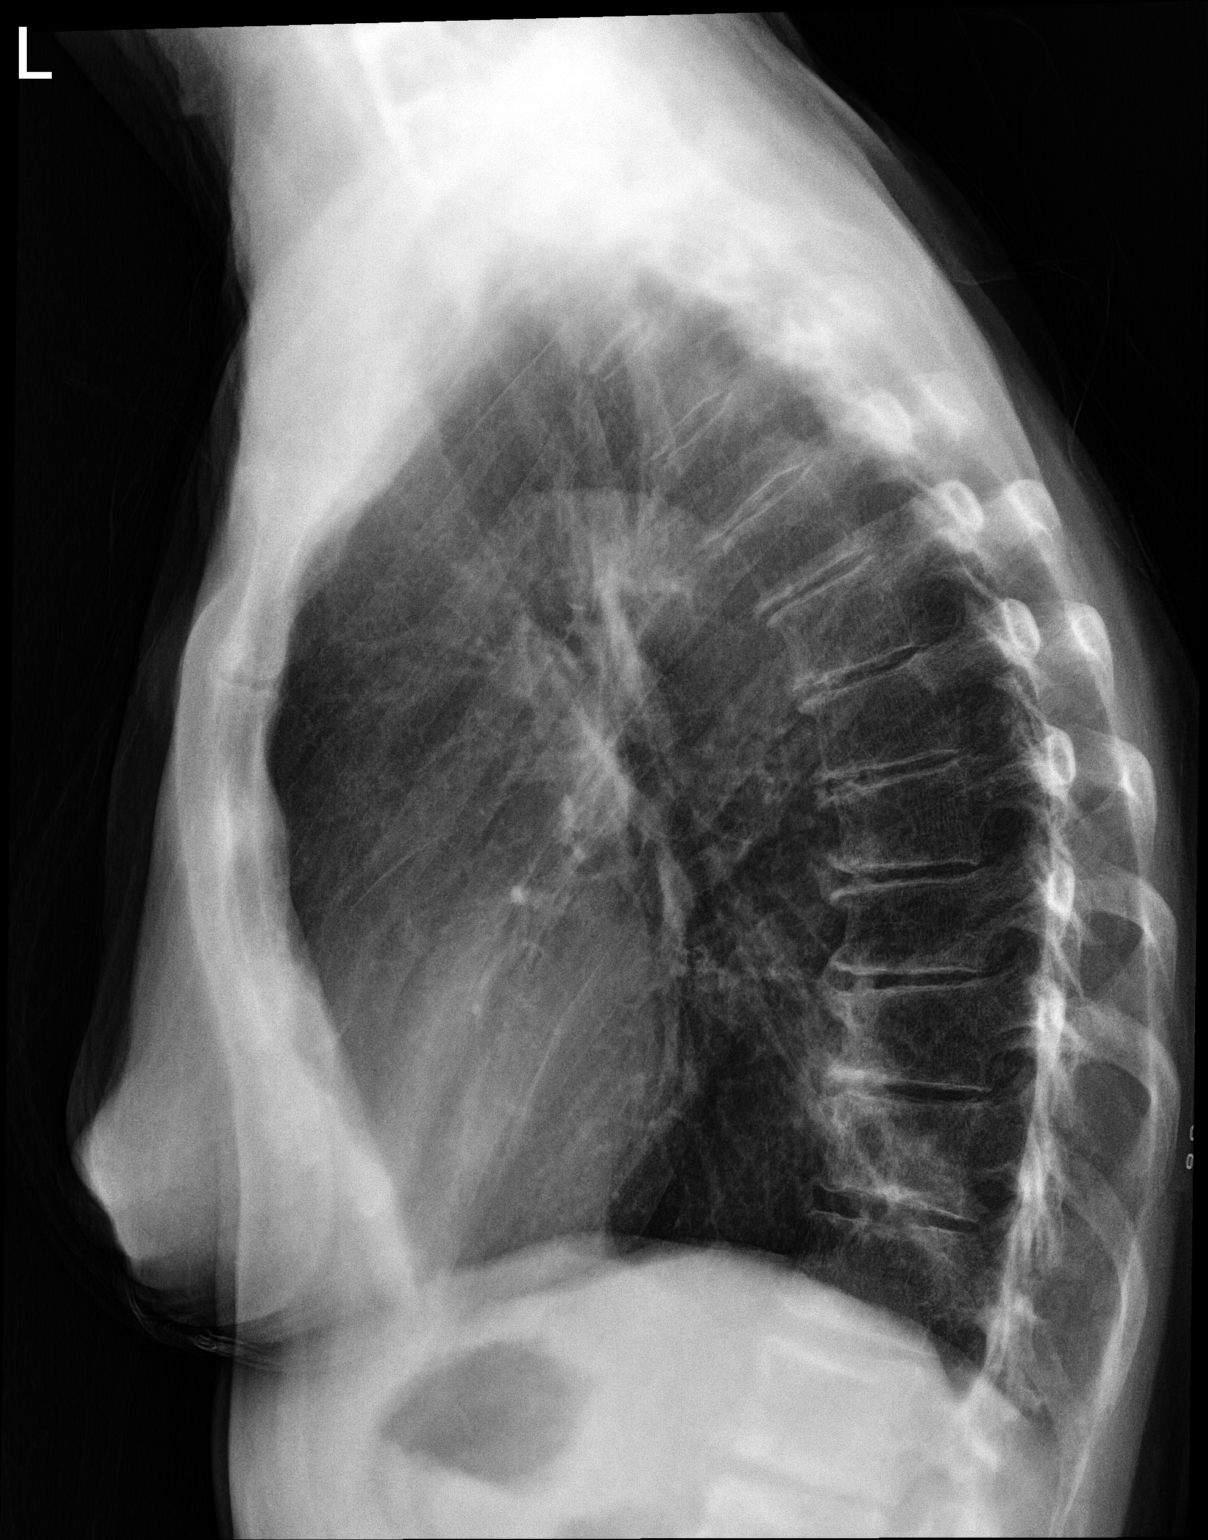

[2 of 2 positions shown; findings below may reference images not displayed]

FINDINGS: The lungs are well-expanded. There are coarse lung markings in the
left lower lobe posteriorly. The infiltrate here however has
improved considerably. The right lung is clear. The heart and
pulmonary vascularity are normal. There is calcification in the wall
of the aortic arch. The bony thorax exhibits no acute abnormality.
IMPRESSION: Decreased size of the left lower lobe infiltrate. An additional
follow-up chest x-ray in 2-3 weeks is recommended to assure complete
clearing.

Thoracic aortic atherosclerosis.

## 2018-07-22 ENCOUNTER — Other Ambulatory Visit: Payer: Self-pay | Admitting: Adult Health

## 2019-01-19 ENCOUNTER — Other Ambulatory Visit: Payer: Self-pay | Admitting: Adult Health

## 2019-02-10 ENCOUNTER — Other Ambulatory Visit: Payer: Self-pay | Admitting: Adult Health

## 2019-03-19 ENCOUNTER — Other Ambulatory Visit: Payer: Self-pay | Admitting: Adult Health

## 2019-03-27 ENCOUNTER — Other Ambulatory Visit (HOSPITAL_COMMUNITY): Payer: Self-pay | Admitting: Adult Health

## 2019-03-27 DIAGNOSIS — Z1231 Encounter for screening mammogram for malignant neoplasm of breast: Secondary | ICD-10-CM

## 2019-04-06 ENCOUNTER — Other Ambulatory Visit: Payer: Self-pay | Admitting: Adult Health

## 2019-04-06 ENCOUNTER — Telehealth: Payer: Self-pay | Admitting: Adult Health

## 2019-04-06 NOTE — Telephone Encounter (Signed)

## 2019-04-07 ENCOUNTER — Other Ambulatory Visit: Payer: Self-pay

## 2019-04-07 ENCOUNTER — Encounter: Payer: Self-pay | Admitting: Adult Health

## 2019-04-07 ENCOUNTER — Ambulatory Visit (INDEPENDENT_AMBULATORY_CARE_PROVIDER_SITE_OTHER): Payer: BC Managed Care – PPO | Admitting: Adult Health

## 2019-04-07 VITALS — BP 145/77 | HR 62 | Ht 65.0 in | Wt 115.0 lb

## 2019-04-07 DIAGNOSIS — E7800 Pure hypercholesterolemia, unspecified: Secondary | ICD-10-CM | POA: Insufficient documentation

## 2019-04-07 DIAGNOSIS — Z1212 Encounter for screening for malignant neoplasm of rectum: Secondary | ICD-10-CM | POA: Diagnosis not present

## 2019-04-07 DIAGNOSIS — Z1211 Encounter for screening for malignant neoplasm of colon: Secondary | ICD-10-CM | POA: Diagnosis not present

## 2019-04-07 DIAGNOSIS — Z79899 Other long term (current) drug therapy: Secondary | ICD-10-CM

## 2019-04-07 DIAGNOSIS — F419 Anxiety disorder, unspecified: Secondary | ICD-10-CM

## 2019-04-07 DIAGNOSIS — I10 Essential (primary) hypertension: Secondary | ICD-10-CM

## 2019-04-07 DIAGNOSIS — D509 Iron deficiency anemia, unspecified: Secondary | ICD-10-CM | POA: Insufficient documentation

## 2019-04-07 DIAGNOSIS — Z01419 Encounter for gynecological examination (general) (routine) without abnormal findings: Secondary | ICD-10-CM | POA: Diagnosis not present

## 2019-04-07 LAB — HEMOCCULT GUIAC POC 1CARD (OFFICE): Fecal Occult Blood, POC: NEGATIVE

## 2019-04-07 NOTE — Progress Notes (Signed)
Patient ID: [name], female   DOB: 1961/03/26, 58 y.o.   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  PCP is Dr Hilma Favors.    Current Medications, Allergies, Past Medical History, Past Surgical History, Family History and Social History were reviewed in [employer] record.     Review of Systems: Patient denies any headaches, hearing loss, fatigue, blurred vision, shortness of breath, chest pain, abdominal pain, problems with bowel movements, urination, or intercourse. No joint pain or mood swings. She says since back surgery feels better and has gained some weight back.    Physical Exam:BP (!) 145/77 (BP Location: Left Arm, Patient Position: Sitting, Cuff Size: Normal)   Pulse 62   Ht 5\' 5"  (1.651 m)   Wt 115 lb (52.2 kg)   BMI 19.14 kg/m  General:  Well developed, well nourished, no acute distress Skin:  Warm and dry Neck:  Midline trachea, normal thyroid, good ROM, no lymphadenopathy Lungs; Clear to auscultation bilaterally Breast:  No dominant palpable mass, retraction, or nipple discharge Cardiovascular: Regular rate and rhythm Abdomen:  Soft, non tender, no hepatosplenomegaly Pelvic:  External genitalia is normal in appearance, no lesions.  The vagina is pale with loss of moisture and rugae. Urethra has no lesions or masses. The cervix and uterus are absent.  No adnexal masses or tenderness noted.Bladder is non tender, no masses felt. Rectal: Good sphincter tone, no polyps, or hemorrhoids felt.  Hemoccult negative. Extremities/musculoskeletal:  No swelling or varicosities noted, no clubbing or cyanosis Psych:  No mood changes, alert and cooperative,seems happy PHQ 2 score 0. Fall risk is low. Exam performed with pt permission without chaperone.   Impression: 1. Encounter for well woman exam with routine  gynecological exam   2. Screening for colorectal cancer   3. Essential hypertension   4. Current use of estrogen therapy   5. Anxiety   6. Elevated cholesterol   7. Iron deficiency anemia, unspecified iron deficiency anemia type       Plan: Orders given to get fasting labs in near future, CBC,CMP,TSH and lipids Mammogram is 7/9 Physical in 1 year Continue meds, has refills

## 2019-04-09 ENCOUNTER — Other Ambulatory Visit: Payer: Self-pay

## 2019-04-09 ENCOUNTER — Ambulatory Visit (HOSPITAL_COMMUNITY)
Admission: RE | Admit: 2019-04-09 | Discharge: 2019-04-09 | Disposition: A | Discharge status: Home / Self Care | Payer: BC Managed Care – PPO | Source: Ambulatory Visit | Attending: Adult Health | Admitting: Adult Health

## 2019-04-09 ENCOUNTER — Ambulatory Visit (HOSPITAL_COMMUNITY): Payer: BLUE CROSS/BLUE SHIELD

## 2019-04-09 DIAGNOSIS — Z1231 Encounter for screening mammogram for malignant neoplasm of breast: Secondary | ICD-10-CM | POA: Insufficient documentation

## 2019-04-15 LAB — COMPREHENSIVE METABOLIC PANEL
ALT: 21 IU/L (ref 0–32)
AST: 25 IU/L (ref 0–40)
Albumin/Globulin Ratio: 1.5 (ref 1.2–2.2)
Albumin: 3.7 g/dL — ABNORMAL LOW (ref 3.8–4.9)
Alkaline Phosphatase: 80 IU/L (ref 39–117)
BUN/Creatinine Ratio: 8 — ABNORMAL LOW (ref 9–23)
BUN: 9 mg/dL (ref 6–24)
Bilirubin Total: 0.2 mg/dL (ref 0.0–1.2)
CO2: 23 mmol/L (ref 20–29)
Calcium: 8.9 mg/dL (ref 8.7–10.2)
Chloride: 100 mmol/L (ref 96–106)
Creatinine, Ser: 1.11 mg/dL — ABNORMAL HIGH (ref 0.57–1.00)
GFR calc Af Amer: 63 mL/min/{1.73_m2} (ref >59)
GFR calc non Af Amer: 55 mL/min/{1.73_m2} — ABNORMAL LOW (ref >59)
Globulin, Total: 2.5 g/dL (ref 1.5–4.5)
Glucose: 81 mg/dL (ref 65–99)
Potassium: 4.7 mmol/L (ref 3.5–5.2)
Sodium: 137 mmol/L (ref 134–144)
Total Protein: 6.2 g/dL (ref 6.0–8.5)

## 2019-04-15 LAB — CBC
Hematocrit: 36.4 % (ref 34.0–46.6)
Hemoglobin: 11.6 g/dL (ref 11.1–15.9)
MCH: 27.6 pg (ref 26.6–33.0)
MCHC: 31.9 g/dL (ref 31.5–35.7)
MCV: 87 fL (ref 79–97)
Platelets: 249 10*3/uL (ref 150–450)
RBC: 4.2 x10E6/uL (ref 3.77–5.28)
RDW: 15.6 % — ABNORMAL HIGH (ref 11.7–15.4)
WBC: 7 10*3/uL (ref 3.4–10.8)

## 2019-04-15 LAB — LIPID PANEL
Chol/HDL Ratio: 5.3 ratio — ABNORMAL HIGH (ref 0.0–4.4)
Cholesterol, Total: 106 mg/dL (ref 100–199)
HDL: 20 mg/dL — ABNORMAL LOW (ref >39)
LDL Calculated: 68 mg/dL (ref 0–99)
Triglycerides: 91 mg/dL (ref 0–149)
VLDL Cholesterol Cal: 18 mg/dL (ref 5–40)

## 2019-04-15 LAB — TSH: TSH: 0.837 u[IU]/mL (ref 0.450–4.500)

## 2019-04-20 ENCOUNTER — Telehealth: Payer: Self-pay | Admitting: Adult Health

## 2019-04-20 NOTE — Telephone Encounter (Signed)
Pt aware of labs, increase activity, take omega 3, eat salmon, and increase fluids, water.

## 2019-05-07 ENCOUNTER — Other Ambulatory Visit: Payer: Self-pay

## 2019-05-07 ENCOUNTER — Ambulatory Visit (INDEPENDENT_AMBULATORY_CARE_PROVIDER_SITE_OTHER): Payer: BC Managed Care – PPO | Admitting: Nurse Practitioner

## 2019-05-07 ENCOUNTER — Encounter (INDEPENDENT_AMBULATORY_CARE_PROVIDER_SITE_OTHER): Payer: Self-pay | Admitting: Nurse Practitioner

## 2019-05-07 VITALS — BP 189/72 | HR 51 | Temp 98.0°F | Resp 18 | Ht 65.0 in | Wt 116.0 lb

## 2019-05-07 DIAGNOSIS — D509 Iron deficiency anemia, unspecified: Secondary | ICD-10-CM

## 2019-05-07 DIAGNOSIS — K900 Celiac disease: Secondary | ICD-10-CM | POA: Insufficient documentation

## 2019-05-07 DIAGNOSIS — K219 Gastro-esophageal reflux disease without esophagitis: Secondary | ICD-10-CM | POA: Diagnosis not present

## 2019-05-07 MED ORDER — OMEPRAZOLE 40 MG PO CPDR: 40.0000 mg | DELAYED_RELEASE_CAPSULE | Freq: Every day | ORAL | 3 refills | Status: DC

## 2019-05-07 NOTE — Patient Instructions (Signed)
1. Continue Gluten Free Diet  2. Continue Omeprazole 40mg  once daily, your prescription was renewed  3. Repeat CBC, iron studies and Celiac Panel in 1 year, follow up in office in 1 year and as needed

## 2019-05-07 NOTE — Progress Notes (Signed)
   Subjective:    Patient ID: [name], female    DOB: 02-Apr-1961, 58 y.o.   MRN: [id number]  HPI  [name] presents today for IDA and Celiac disease follow up. Celiac disease diagnosed at the time of her EGD in 01/17/2017. She adheres to a 100% gluten free diet. She denies having any abdominal pain. She is passing a normal formed stool daily. No rectal bleeding. She reports her weight has been stable and appetite is good. She takes a child's vitamin with iron. She received IV iron x 1 possibly 1 year ago. Labs 04/14/2019: Hg 11.6. HCT 36.4. (07/2018 Hg was 10.7).    She underwent a colonoscopy 01/13/2014 that was normal, advised to repeat a colonoscopy in 10 years.       Objective:   Physical Exam  BP (!) 189/72   Pulse (!) 51   Temp 98 F (36.7 C) (Oral)   Resp 18   Ht 5\' 5"  (1.651 m)   Wt 116 lb (52.6 kg)   BMI 19.30 kg/m   General: thin 58 y.o. female in NAD Neck: supple, no thyromegaly or lymphadenopathy RRR, lungs clear throughout  Abdomen: soft, nontender, + BS x 4 quads Extremities: no edema    Assessment & Plan:   1. 58 y.o. female with a hx of IDA secondary to Celiac disease. Hg stable -repeat CBC, iron studies and Celiac panel in 1 year or earlier if symptoms warrant -continue gluten free diet -follow up in office in 1 year and as needed

## 2019-05-25 ENCOUNTER — Ambulatory Visit (INDEPENDENT_AMBULATORY_CARE_PROVIDER_SITE_OTHER): Payer: BLUE CROSS/BLUE SHIELD | Admitting: Internal Medicine

## 2019-06-23 ENCOUNTER — Telehealth: Payer: Self-pay | Admitting: *Deleted

## 2019-06-23 NOTE — Telephone Encounter (Signed)
Patient called and left message stating that she talked to [facility] and she told her to call Maudie Mercury to get a doctors note for her flu shot at [facility].

## 2019-06-23 NOTE — Telephone Encounter (Signed)
Note printed for pt to get flu shot at [facility] in [place]. Pt advised to pick up at front desk. [place]

## 2019-07-12 ENCOUNTER — Other Ambulatory Visit: Payer: Self-pay | Admitting: Adult Health

## 2019-09-06 IMAGING — MG DIGITAL SCREENING BILATERAL MAMMOGRAM WITH CAD
4 series · 4 of 4 positions shown · non-contrast
Comparison: Previous exam(s).

CLINICAL DATA: Screening.

EXAM:
DIGITAL SCREENING BILATERAL MAMMOGRAM WITH CAD

[L CC]
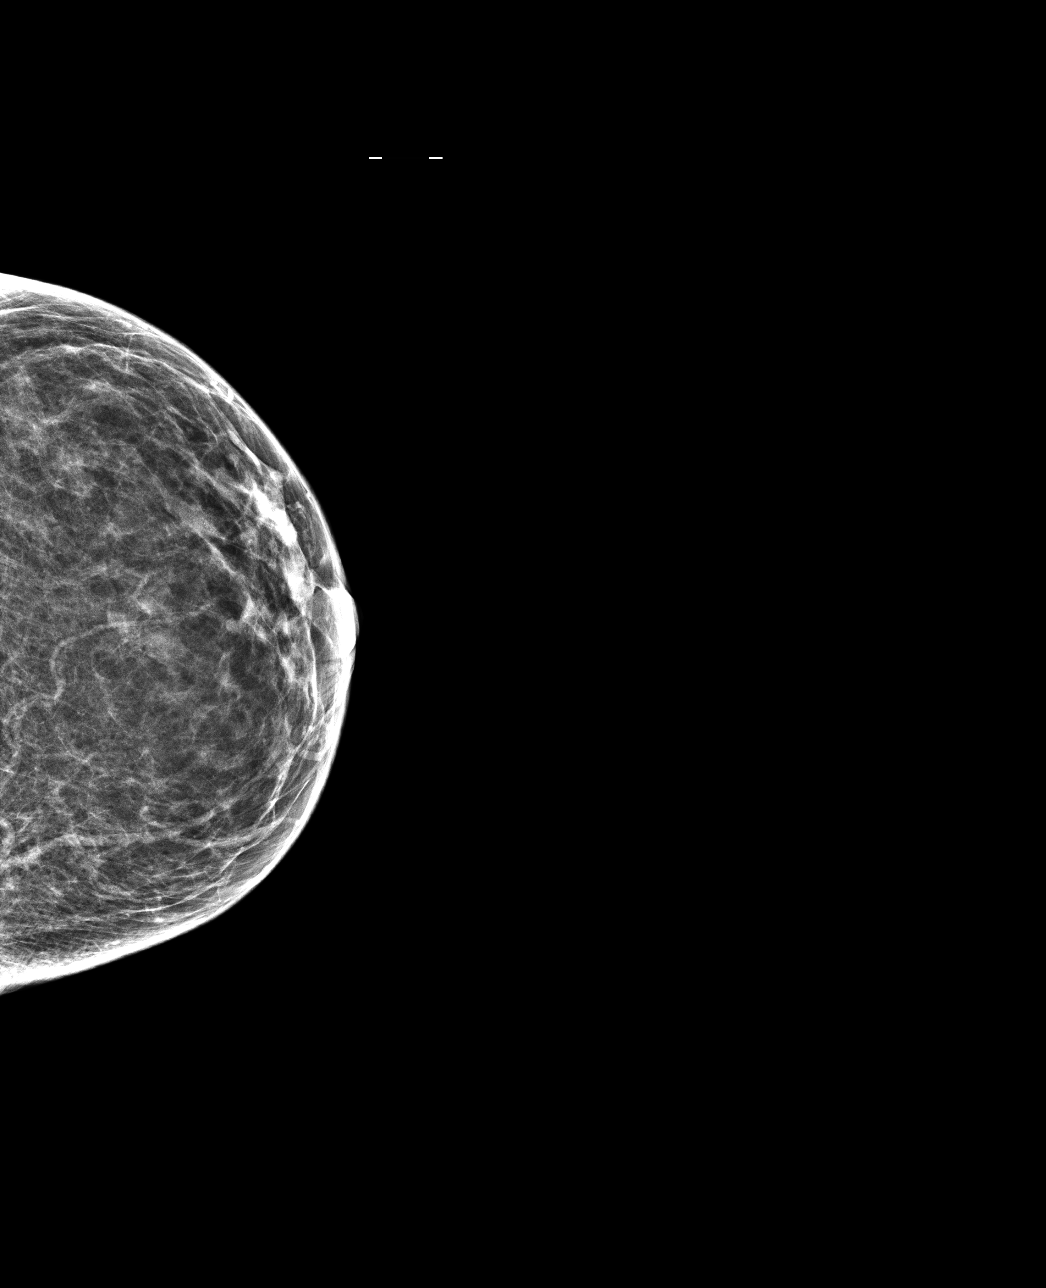

[L MLO]
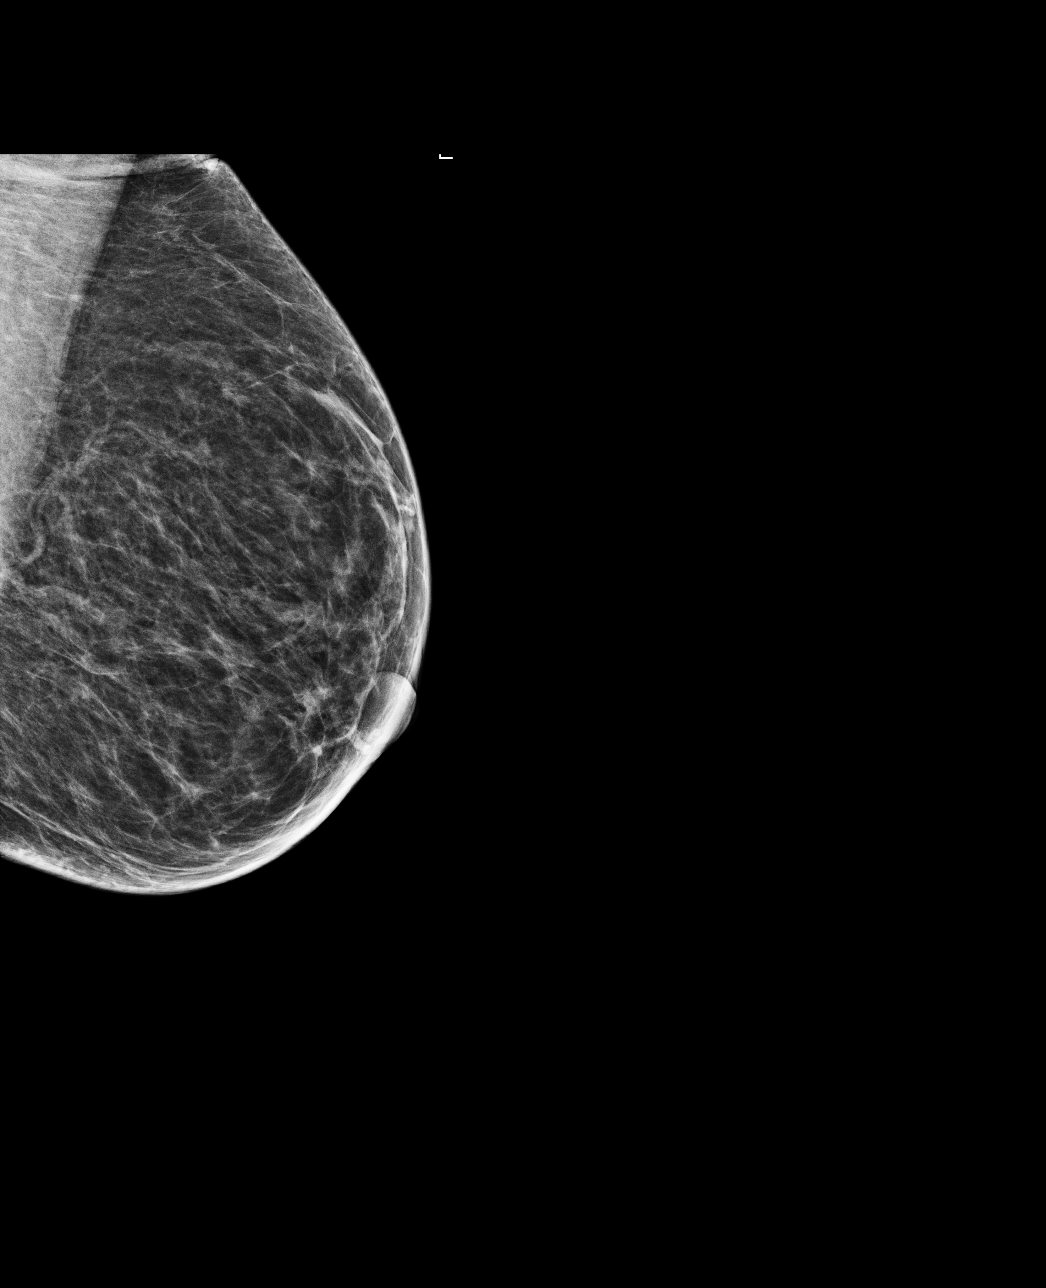

[R CC]
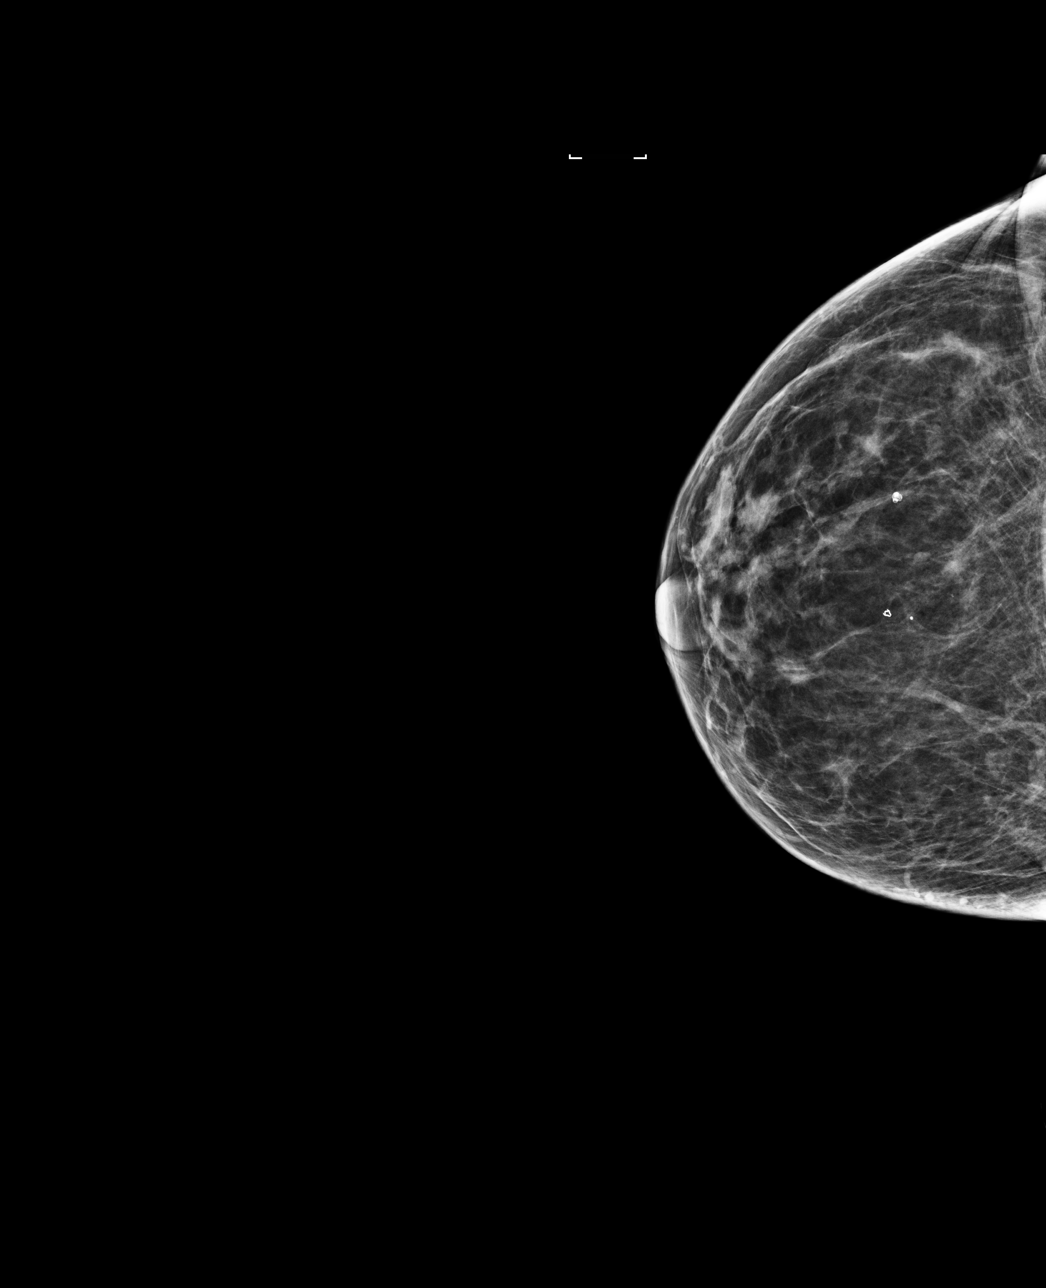

[R MLO]
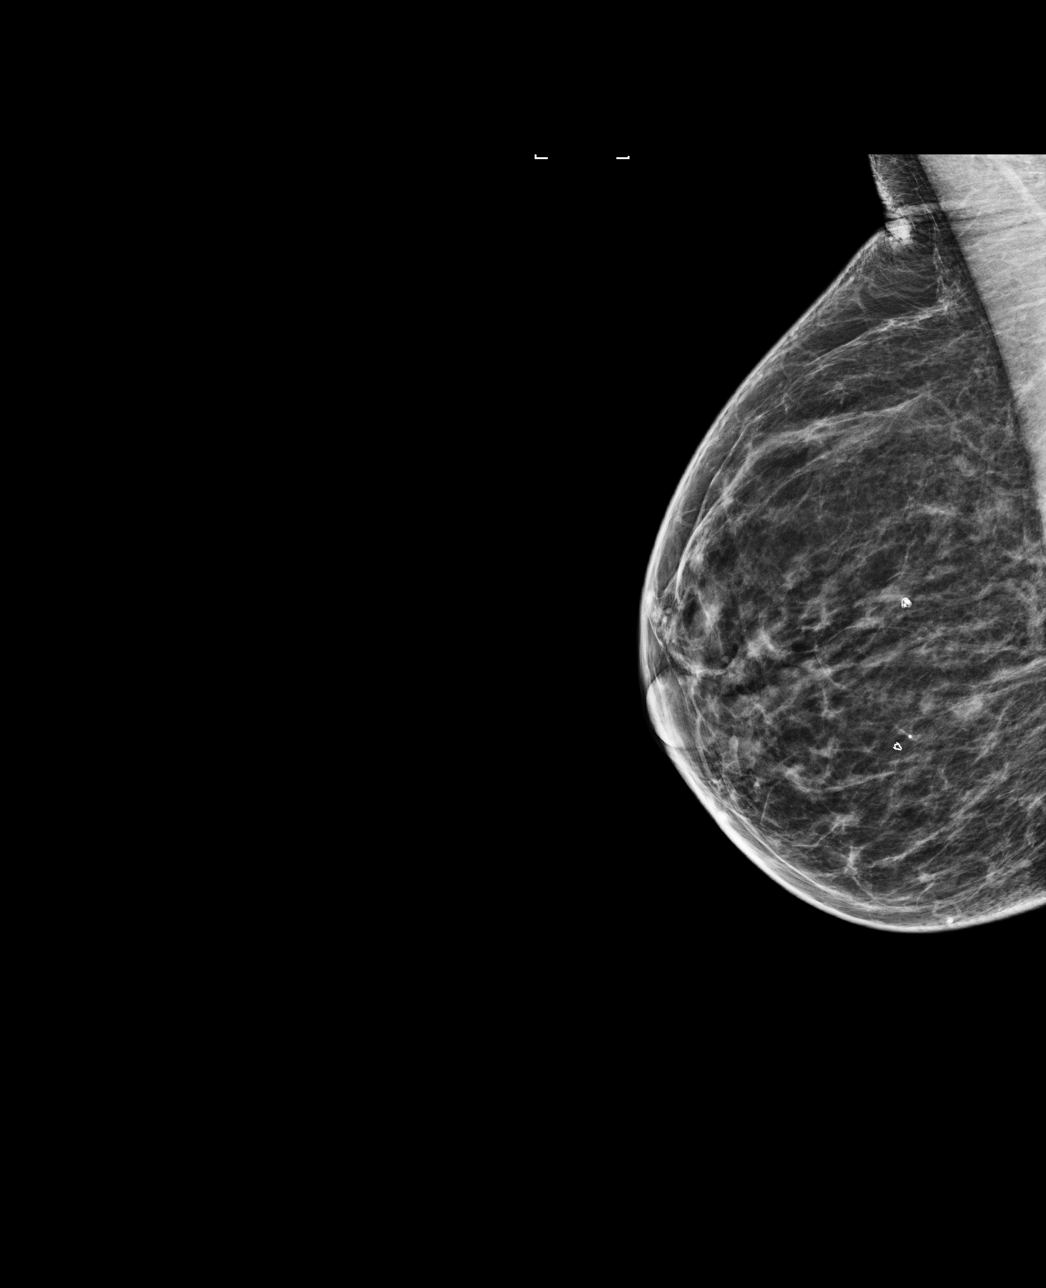

[4 of 4 positions shown; findings below may reference images not displayed]

ACR Breast Density Category c: The breast tissue is heterogeneously
dense, which may obscure small masses.
FINDINGS: There are no findings suspicious for malignancy. Images were
processed with CAD.
IMPRESSION: No mammographic evidence of malignancy. A result letter of this
screening mammogram will be mailed directly to the patient.

RECOMMENDATION:
Screening mammogram in one year. (Code:[id number])

BI-RADS CATEGORY  1: Negative.

## 2020-01-03 ENCOUNTER — Other Ambulatory Visit: Payer: Self-pay | Admitting: Adult Health

## 2020-01-29 ENCOUNTER — Other Ambulatory Visit: Payer: Self-pay | Admitting: Adult Health

## 2020-03-06 ENCOUNTER — Other Ambulatory Visit: Payer: Self-pay | Admitting: Adult Health

## 2020-04-04 ENCOUNTER — Other Ambulatory Visit (HOSPITAL_COMMUNITY): Payer: Self-pay | Admitting: Adult Health

## 2020-04-04 DIAGNOSIS — Z1231 Encounter for screening mammogram for malignant neoplasm of breast: Secondary | ICD-10-CM

## 2020-04-06 ENCOUNTER — Telehealth: Payer: Self-pay | Admitting: Adult Health

## 2020-04-06 NOTE — Telephone Encounter (Signed)
Called patient regarding appointment scheduled in our office.  Updated visitor policy: We are now allowing one support person with you during your upcoming visit.  However, we do ask that they wear a mask and will also be screened at check-in.   Pre-screen questions asked: 1. Any of the following symptoms of COVID such as chills, fever, cough, shortness of breath, muscle pain, diarrhea, rash, vomiting, abdominal pain, red eye, weakness, bruising, bleeding, joint pain, loss of taste or smell, a severe headache, sore throat, fatigue 2. Any exposure to anyone suspected or confirmed of having COVID-19 3. Awaiting test results for COVID-19  Also,to keep you safe, please use the provided hand sanitizer when you enter the office. We are asking everyone in the office to wear a mask to help prevent the spread of germs. If you have a mask of your own, please wear it to your appointment, if not, we are happy to provide one for you.  Thank you for understanding and your cooperation.    [facility] Staff

## 2020-04-07 ENCOUNTER — Encounter: Payer: Self-pay | Admitting: Adult Health

## 2020-04-07 ENCOUNTER — Ambulatory Visit (INDEPENDENT_AMBULATORY_CARE_PROVIDER_SITE_OTHER): Payer: BC Managed Care – PPO | Admitting: Adult Health

## 2020-04-07 VITALS — BP 164/72 | HR 65 | Ht 65.0 in | Wt 119.0 lb

## 2020-04-07 DIAGNOSIS — Z1211 Encounter for screening for malignant neoplasm of colon: Secondary | ICD-10-CM | POA: Diagnosis not present

## 2020-04-07 DIAGNOSIS — F419 Anxiety disorder, unspecified: Secondary | ICD-10-CM

## 2020-04-07 DIAGNOSIS — Z01419 Encounter for gynecological examination (general) (routine) without abnormal findings: Secondary | ICD-10-CM

## 2020-04-07 DIAGNOSIS — Z79899 Other long term (current) drug therapy: Secondary | ICD-10-CM

## 2020-04-07 DIAGNOSIS — E7800 Pure hypercholesterolemia, unspecified: Secondary | ICD-10-CM

## 2020-04-07 DIAGNOSIS — I10 Essential (primary) hypertension: Secondary | ICD-10-CM | POA: Diagnosis not present

## 2020-04-07 LAB — HEMOCCULT GUIAC POC 1CARD (OFFICE): Fecal Occult Blood, POC: NEGATIVE

## 2020-04-07 MED ORDER — LISINOPRIL 10 MG PO TABS: 10.0000 mg | ORAL_TABLET | Freq: Every day | ORAL | 3 refills | Status: DC

## 2020-04-07 MED ORDER — SIMVASTATIN 10 MG PO TABS: ORAL_TABLET | ORAL | 3 refills | Status: DC

## 2020-04-07 MED ORDER — EST ESTROGENS-METHYLTEST 1.25-2.5 MG PO TABS: 1.0000 | ORAL_TABLET | Freq: Every day | ORAL | 5 refills | Status: DC

## 2020-04-07 NOTE — Progress Notes (Signed)
Patient ID: [name], female   DOB: [birth year], 59 y.o.   MRN: [id number] History of Present Illness: [name] is a 59 year old white female,married, sp hysterectomy in for a well woman gyn exam. PCP is Dr Hilma Favors.   Current Medications, Allergies, Past Medical History, Past Surgical History, Family History and Social History were reviewed in [employer] record.     Review of Systems:  Patient denies any headaches, hearing loss, fatigue, blurred vision, shortness of breath, chest pain, abdominal pain, problems with bowel movements, urination, or intercourse. No joint pain.happy with estra test Is having some mood swings   Physical Exam:BP (!) 164/72 (BP Location: Right Arm, Patient Position: Sitting, Cuff Size: Normal)    Pulse 65    Ht 5' 5"  (1.651 m)    Wt 119 lb (54 kg)    BMI 19.80 kg/m  General:  Well developed, well nourished, no acute distress Skin:  Warm and dry Neck:  Midline trachea, normal thyroid, good ROM, no lymphadenopathy Lungs; Clear to auscultation bilaterally Breast:  No dominant palpable mass, retraction, or nipple discharge Cardiovascular: Regular rate and rhythm Abdomen:  Soft, non tender, no hepatosplenomegaly Pelvic:  External genitalia is normal in appearance, no lesions.  The vagina is normal in appearance. Urethra has no lesions or masses. The cervix and uterus are absent.  No adnexal masses or tenderness noted.Bladder is non tender, no masses felt. Rectal: Good sphincter tone, no polyps, or hemorrhoids felt.  Hemoccult negative. Extremities/musculoskeletal:  No swelling or varicosities noted, no clubbing or cyanosis Psych:  No mood changes, alert and cooperative,seems happy AA is o Fall risk is low PHQ 9 score is 0. Examination chaperoned by Dwyane Dee LPN  Impression and Plan: 1. Encounter for well woman exam with routine gynecological exam Physical in 1 year Mammogram yearly Check CBC,CMP,TSH   2. Essential  hypertension Continue lisinopril   Meds ordered this encounter  Medications   estrogens-methylTEST (ESTRATEST) 1.25-2.5 MG tablet    Sig: Take 1 tablet by mouth daily.    Dispense:  30 tablet    Refill:  5    This request is for a new prescription for a controlled substance as required by Federal/State law.    Order Specific Question:   Supervising Provider    Answer:   Tania Ade H [[id number]]   lisinopril (ZESTRIL) 10 MG tablet    Sig: Take 1 tablet (10 mg total) by mouth daily.    Dispense:  90 tablet    Refill:  3    Order Specific Question:   Supervising Provider    Answer:   Tania Ade H [[id number]]   simvastatin (ZOCOR) 10 MG tablet    Sig: Take 1 at HS    Dispense:  90 tablet    Refill:  3    Order Specific Question:   Supervising Provider    Answer:   Elonda Husky, LUTHER H [[id number]]    3. Current use of estrogen therapy Will refill estra test  4. Anxiety Has buspar at home, increase 5 mg to tid,let me know if refill needed   5. Elevated cholesterol Continue zocor Check cholesterol   6. Encounter for screening fecal occult blood testing Colonoscopy pe rGI

## 2020-04-08 LAB — CBC
Hematocrit: 36.7 % (ref 34.0–46.6)
Hemoglobin: 11 g/dL — ABNORMAL LOW (ref 11.1–15.9)
MCH: 24.8 pg — ABNORMAL LOW (ref 26.6–33.0)
MCHC: 30 g/dL — ABNORMAL LOW (ref 31.5–35.7)
MCV: 83 fL (ref 79–97)
Platelets: 286 10*3/uL (ref 150–450)
RBC: 4.43 x10E6/uL (ref 3.77–5.28)
RDW: 17.5 % — ABNORMAL HIGH (ref 11.7–15.4)
WBC: 7 10*3/uL (ref 3.4–10.8)

## 2020-04-08 LAB — COMPREHENSIVE METABOLIC PANEL
ALT: 20 IU/L (ref 0–32)
AST: 28 IU/L (ref 0–40)
Albumin/Globulin Ratio: 1.4 (ref 1.2–2.2)
Albumin: 3.8 g/dL (ref 3.8–4.9)
Alkaline Phosphatase: 87 IU/L (ref 48–121)
BUN/Creatinine Ratio: 5 — ABNORMAL LOW (ref 9–23)
BUN: 6 mg/dL (ref 6–24)
Bilirubin Total: 0.2 mg/dL (ref 0.0–1.2)
CO2: 24 mmol/L (ref 20–29)
Calcium: 8.7 mg/dL (ref 8.7–10.2)
Chloride: 100 mmol/L (ref 96–106)
Creatinine, Ser: 1.22 mg/dL — ABNORMAL HIGH (ref 0.57–1.00)
GFR calc Af Amer: 56 mL/min/{1.73_m2} — ABNORMAL LOW (ref >59)
GFR calc non Af Amer: 49 mL/min/{1.73_m2} — ABNORMAL LOW (ref >59)
Globulin, Total: 2.8 g/dL (ref 1.5–4.5)
Glucose: 72 mg/dL (ref 65–99)
Potassium: 4.4 mmol/L (ref 3.5–5.2)
Sodium: 136 mmol/L (ref 134–144)
Total Protein: 6.6 g/dL (ref 6.0–8.5)

## 2020-04-08 LAB — LIPID PANEL
Chol/HDL Ratio: 4.9 ratio — ABNORMAL HIGH (ref 0.0–4.4)
Cholesterol, Total: 103 mg/dL (ref 100–199)
HDL: 21 mg/dL — ABNORMAL LOW (ref >39)
LDL Chol Calc (NIH): 61 mg/dL (ref 0–99)
Triglycerides: 113 mg/dL (ref 0–149)
VLDL Cholesterol Cal: 21 mg/dL (ref 5–40)

## 2020-04-08 LAB — TSH: TSH: 0.916 u[IU]/mL (ref 0.450–4.500)

## 2020-04-12 ENCOUNTER — Telehealth: Payer: Self-pay | Admitting: Adult Health

## 2020-04-12 NOTE — Telephone Encounter (Signed)
Pt aware that HGB is 11 and MCHC MCV and RDW off, so call Dr Laural Golden and creatinine elevated again, increase water,probably dehydrated.

## 2020-04-21 ENCOUNTER — Other Ambulatory Visit: Payer: Self-pay

## 2020-04-21 ENCOUNTER — Ambulatory Visit (HOSPITAL_COMMUNITY)
Admission: RE | Admit: 2020-04-21 | Discharge: 2020-04-21 | Disposition: A | Discharge status: Home / Self Care | Payer: BC Managed Care – PPO | Source: Ambulatory Visit | Attending: Adult Health | Admitting: Adult Health

## 2020-04-21 DIAGNOSIS — Z1231 Encounter for screening mammogram for malignant neoplasm of breast: Secondary | ICD-10-CM

## 2020-05-09 ENCOUNTER — Encounter (INDEPENDENT_AMBULATORY_CARE_PROVIDER_SITE_OTHER): Payer: Self-pay | Admitting: Gastroenterology

## 2020-05-09 ENCOUNTER — Ambulatory Visit (INDEPENDENT_AMBULATORY_CARE_PROVIDER_SITE_OTHER): Payer: BC Managed Care – PPO | Admitting: Gastroenterology

## 2020-05-09 ENCOUNTER — Other Ambulatory Visit: Payer: Self-pay

## 2020-05-09 VITALS — BP 164/64 | HR 56 | Temp 97.7°F | Ht 65.0 in | Wt 116.9 lb

## 2020-05-09 DIAGNOSIS — D649 Anemia, unspecified: Secondary | ICD-10-CM | POA: Diagnosis not present

## 2020-05-09 DIAGNOSIS — K219 Gastro-esophageal reflux disease without esophagitis: Secondary | ICD-10-CM | POA: Diagnosis not present

## 2020-05-09 LAB — IRON,TIBC AND FERRITIN PANEL
%SAT: 7 % (calc) — ABNORMAL LOW (ref 16–45)
Ferritin: 7 ng/mL — ABNORMAL LOW (ref 16–232)
Iron: 23 ug/dL — ABNORMAL LOW (ref 45–160)
TIBC: 348 mcg/dL (calc) (ref 250–450)

## 2020-05-09 LAB — CBC WITH DIFFERENTIAL/PLATELET
Absolute Monocytes: 553 cells/uL (ref 200–950)
Basophils Absolute: 91 cells/uL (ref 0–200)
Basophils Relative: 1.3 %
Eosinophils Absolute: 252 cells/uL (ref 15–500)
Eosinophils Relative: 3.6 %
HCT: 36.2 % (ref 35.0–45.0)
Hemoglobin: 11.1 g/dL — ABNORMAL LOW (ref 11.7–15.5)
Lymphs Abs: 2226 cells/uL (ref 850–3900)
MCH: 26.1 pg — ABNORMAL LOW (ref 27.0–33.0)
MCHC: 30.7 g/dL — ABNORMAL LOW (ref 32.0–36.0)
MCV: 85.2 fL (ref 80.0–100.0)
MPV: 10.9 fL (ref 7.5–12.5)
Monocytes Relative: 7.9 %
Neutro Abs: 3878 cells/uL (ref 1500–7800)
Neutrophils Relative %: 55.4 %
Platelets: 282 10*3/uL (ref 140–400)
RBC: 4.25 10*6/uL (ref 3.80–5.10)
RDW: 17.2 % — ABNORMAL HIGH (ref 11.0–15.0)
Total Lymphocyte: 31.8 %
WBC: 7 10*3/uL (ref 3.8–10.8)

## 2020-05-09 MED ORDER — OMEPRAZOLE 40 MG PO CPDR: 40.0000 mg | DELAYED_RELEASE_CAPSULE | Freq: Every day | ORAL | 3 refills | Status: DC

## 2020-05-09 NOTE — Patient Instructions (Signed)
GERD instructions: -Please avoid lying flat within 2 to 3 hours of eating, this will make reflux symptoms worse. -Some patients find elevating the head of the bed beneficial. -Avoid spicy greasy foods as well as caffeine, coffee, sodas-these food/drinks can worsen heartburn and reflux. -Tobacco will worsen reflux, please try to decrease/eliminate tobacco intake if applicable. -Avoid NSAID products (ibuprofen, aspirin, Advil, Aleve, Goody's, BCs, Alka-Seltzer) - if needing these occasionally please try to take with meal or snack to decrease stomach irritation. -If taking medication for reflux such as prilosec, nexium, aciphex, dexilant, prevacid - take 20-30 minutes before a meal for maximum effectiveness.

## 2020-05-09 NOTE — Progress Notes (Signed)
Patient profile: [name] is a 59 y.o. female seen for evaluation of GERD. Last seen in clinic on 05/2020   History of Present Illness: [name] is seen today for yearly f/up. She has PMHx of celiac disease diagnosed 2018.   Having a BM 2-3x/day, usually a good consistency without abdominal pain, diarrhea, dark stools, constipation, diarrhea.  She has reflux symptoms if she misses doses of omeprazole but symptoms are well controlled as long as she takes the omeprazole.  She reports generally not having a good appetite, this has been a chronic issue for several years.  She currently eats 1 meal a day in the afternoons.  She denies any nausea, vomiting, dysphagia.  No epigastric pain.  Former smoker.  No alcohol.  Very limited NSAIDs. 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   Wt Readings from Last 3 Encounters:  05/09/20 116 lb 14.4 oz (53 kg)  04/07/20 119 lb (54 kg)  05/07/19 116 lb (52.6 kg)     Last Colonoscopy: 2015-Prep excellent. Normal mucosa of the cecum, ascending colon, hepatic flexure, transverse colon, splenic flexure, descending and sigmoid colon. Normal mucosa of rectum. Hemorrhoids noted above and below the dentate line   EGD 2018-- Z-line irregular, 36 cm from the incisors. - 2 cm hiatal hernia. - Gastritis. Biopsied. - Duodenitis. - Duodenal diverticulum. - Normal second portion of the duodenum. Biopsied.  Biopsy results reviewed with patient. Duodenal biopsy consistent with celiac disease. Will obtain celiac antibody panel    Past Medical History:  Past Medical History:  Diagnosis Date  . Anxiety   . Back pain of lumbosacaral region with sciatica 01/03/2016  . Celiac disease   . Complication of anesthesia   . Current use of estrogen therapy 12/02/2014  . DJD (degenerative joint disease), lumbosacral 01/11/2016  . Dysuria 01/19/2014  . Fibrocystic breast changes 01/11/2016  . GERD (gastroesophageal reflux disease)    occ  . H/O estrogen  therapy   . Hypertension     Problem List: Patient Active Problem List   Diagnosis Date Noted  . Encounter for screening fecal occult blood testing 04/07/2020  . Celiac disease 05/07/2019  . Encounter for well woman exam with routine gynecological exam 04/07/2019  . Iron deficiency anemia 04/07/2019  . Elevated cholesterol 04/07/2019  . Screening for colorectal cancer 01/16/2018  . Essential hypertension 01/10/2017  . Absolute anemia 01/10/2017  . Elevated serum creatinine 01/10/2017  . Weight loss, unintentional 01/10/2017  . Well woman exam with routine gynecological exam 01/10/2017  . Screening for thyroid disorder 01/10/2017  . Influenza B 12/20/2016  . CAP (community acquired pneumonia) 12/19/2016  . Dehydration 12/19/2016  . Spondylolisthesis at L4-L5 level 09/17/2016  . Fibrocystic breast changes 01/11/2016  . DJD (degenerative joint disease), lumbosacral 01/11/2016  . Back pain of lumbosacaral region with sciatica 01/03/2016  . Current use of estrogen therapy 12/02/2014  . Dysuria 01/19/2014  . Anxiety 12/21/2012  . H/O estrogen therapy 12/21/2012  . WRIST PAIN, RIGHT 07/28/2007  . HIGH BLOOD PRESSURE 07/25/2007    Past Surgical History: Past Surgical History:  Procedure Laterality Date  . APPENDECTOMY  2007   had with bso  . BACK SURGERY     L4 & L5  . BILATERAL SALPINGOOPHORECTOMY  2007  . BIOPSY N/A 01/17/2017   Procedure: BIOPSY;  Surgeon: [name], MD;  Location: [facility];  Service: Endoscopy;  Laterality: N/A;  . CARPAL TUNNEL RELEASE Right    has had 3x  .  CESAREAN SECTION    . COLONOSCOPY N/A 01/13/2014   Procedure: COLONOSCOPY;  Surgeon: [name], MD;  Location: [facility];  Service: Endoscopy;  Laterality: N/A;  930  . ESOPHAGOGASTRODUODENOSCOPY N/A 01/17/2017   Procedure: ESOPHAGOGASTRODUODENOSCOPY (EGD);  Surgeon: [name], MD;  Location: [facility];  Service: Endoscopy;  Laterality: N/A;  2:10  . NECK SURGERY  12/2010    . TOTAL ABDOMINAL HYSTERECTOMY  07/2005    for menorrhagia and ut. fibroids    Allergies: Allergies  Allergen Reactions  . No Known Allergies       Home Medications:  Current Outpatient Medications:  .  busPIRone (BUSPAR) 5 MG tablet, TAKE 1 TABLET BY MOUTH TWICE A DAY, Disp: 180 tablet, Rfl: 4 .  estrogens-methylTEST (ESTRATEST) 1.25-2.5 MG tablet, Take 1 tablet by mouth daily., Disp: 30 tablet, Rfl: 5 .  lisinopril (ZESTRIL) 10 MG tablet, Take 1 tablet (10 mg total) by mouth daily., Disp: 90 tablet, Rfl: 3 .  omeprazole (PRILOSEC) 40 MG capsule, Take 1 capsule (40 mg total) by mouth daily., Disp: 90 capsule, Rfl: 3 .  Pediatric Multivitamins-Iron (FLINTSTONES PLUS IRON) chewable tablet, Chew 1 tablet by mouth 2 (two) times daily., Disp: , Rfl:  .  simvastatin (ZOCOR) 10 MG tablet, Take 1 at HS, Disp: 90 tablet, Rfl: 3 .  acyclovir (ZOVIRAX) 400 MG tablet, Take 400 mg by mouth 3 (three) times daily. (Patient not taking: Reported on 05/09/2020), Disp: , Rfl:    Family History: family history includes Alzheimer's disease in her father; COPD in her mother; Cancer in her sister; Diabetes in her sister; Heart attack in her brother; Hypertension in her brother and sister; Parkinsonism in her father.    Social History:   reports that she quit smoking about 23 years ago. Her smoking use included cigarettes. She has a 1.25 pack-year smoking history. She has never used smokeless tobacco. She reports that she does not drink alcohol and does not use drugs.   Review of Systems: Constitutional: Denies weight loss/weight gain  Eyes: No changes in vision. ENT: No oral lesions, sore throat.  GI: see HPI.  Heme/Lymph: No easy bruising.  CV: No chest pain.  GU: No hematuria.  Integumentary: No rashes.  Neuro: No headaches.  Psych: No depression/anxiety.  Endocrine: No heat/cold intolerance.  Allergic/Immunologic: No urticaria.  Resp: No cough, SOB.  Musculoskeletal: No joint swelling.     Physical Examination: BP (!) 164/64 (BP Location: Right Arm, Cuff Size: Normal)   Pulse (!) 56   Temp 97.7 F (36.5 C) (Oral)   Ht 5' 5"  (1.651 m)   Wt 116 lb 14.4 oz (53 kg)   BMI 19.45 kg/m  Gen: NAD, alert and oriented x 4 HEENT: PEERLA, EOMI, Neck: supple, no JVD Chest: CTA bilaterally, no wheezes, crackles, or other adventitious sounds CV: RRR, no m/g/c/r Abd: soft, NT, ND, +BS in all four quadrants; no HSM, guarding, ridigity, or rebound tenderness Ext: no edema, well perfused with 2+ pulses, Skin: no rash or lesions noted on observed skin Lymph: no noted LAD  Data Reviewed:  04/07/20-labs shows creatinine 1.18, GFR 41.  Hemoglobin 11.0, MCV 83.  TSH normal.  Hemoccult cards negative  06/2018--ferritin 9, iron 13, % sat 4, Vit B12 488, folate 12.8      Assessment/Plan: Ms. Mckibbin is a 59 y.o. female seen for follow-up of celiac dx and GERD.   1. Celiac disease-she reports strict compliance with gluten-free diet.  She denies any symptoms. She  does have a slight drop in her hemoglobin but is normocytic, was iron deficient in 2019 and will check an iron today. She reports PCP did not check iron. Will request copy of labs to verify if b12, vit D etc checked.  2. GERD-continue current dose of PPI.  No upper GI alarm symptoms  3. Up-to-date on colonoscopy.  Due 2025      [name] was seen today for follow-up.  Diagnoses and all orders for this visit:  Anemia, unspecified type -     Fe+TIBC+Fer -     CBC with Differential  Gastroesophageal reflux disease without esophagitis -     omeprazole (PRILOSEC) 40 MG capsule; Take 1 capsule (40 mg total) by mouth daily.      I personally performed the service, non-incident to. (WP)  Laurine Blazer, [facility]

## 2020-06-02 ENCOUNTER — Telehealth (INDEPENDENT_AMBULATORY_CARE_PROVIDER_SITE_OTHER): Payer: Self-pay | Admitting: *Deleted

## 2020-06-02 NOTE — Telephone Encounter (Signed)
PA has been completed for the Feraheme IV. An order has been faxed to [facility]/Out patient department.

## 2020-06-09 ENCOUNTER — Other Ambulatory Visit: Payer: Self-pay

## 2020-06-09 ENCOUNTER — Encounter (HOSPITAL_COMMUNITY)
Admission: RE | Admit: 2020-06-09 | Discharge: 2020-06-09 | Disposition: A | Discharge status: Home / Self Care | Payer: BC Managed Care – PPO | Source: Ambulatory Visit | Attending: Nephrology | Admitting: Nephrology

## 2020-06-09 DIAGNOSIS — D509 Iron deficiency anemia, unspecified: Secondary | ICD-10-CM | POA: Insufficient documentation

## 2020-06-09 MED ORDER — SODIUM CHLORIDE 0.9 % IV SOLN
510.0000 mg | Freq: Once | INTRAVENOUS | Status: AC
  Administered 2020-06-09: 510 mg via INTRAVENOUS
  Filled 2020-06-09: qty 510

## 2020-06-09 MED ORDER — SODIUM CHLORIDE 0.9 % IV SOLN: Freq: Once | INTRAVENOUS | Status: AC

## 2020-06-16 ENCOUNTER — Other Ambulatory Visit: Payer: Self-pay

## 2020-06-16 ENCOUNTER — Encounter (HOSPITAL_COMMUNITY): Payer: Self-pay

## 2020-06-16 ENCOUNTER — Encounter (HOSPITAL_COMMUNITY)
Admission: RE | Admit: 2020-06-16 | Discharge: 2020-06-16 | Disposition: A | Discharge status: Home / Self Care | Payer: BC Managed Care – PPO | Source: Ambulatory Visit | Attending: Nephrology | Admitting: Nephrology

## 2020-06-16 DIAGNOSIS — D509 Iron deficiency anemia, unspecified: Secondary | ICD-10-CM | POA: Diagnosis not present

## 2020-06-16 LAB — CBC
HCT: 38.3 % (ref 36.0–46.0)
Hemoglobin: 11.3 g/dL — ABNORMAL LOW (ref 12.0–15.0)
MCH: 26.5 pg (ref 26.0–34.0)
MCHC: 29.5 g/dL — ABNORMAL LOW (ref 30.0–36.0)
MCV: 89.7 fL (ref 80.0–100.0)
Platelets: 260 10*3/uL (ref 150–400)
RBC: 4.27 MIL/uL (ref 3.87–5.11)
RDW: 22.8 % — ABNORMAL HIGH (ref 11.5–15.5)
WBC: 7.9 10*3/uL (ref 4.0–10.5)
nRBC: 0 % (ref 0.0–0.2)

## 2020-06-16 MED ORDER — SODIUM CHLORIDE 0.9 % IV SOLN: Freq: Once | INTRAVENOUS | Status: AC

## 2020-06-16 MED ORDER — SODIUM CHLORIDE 0.9 % IV SOLN
510.0000 mg | Freq: Once | INTRAVENOUS | Status: AC
  Administered 2020-06-16: 510 mg via INTRAVENOUS
  Filled 2020-06-16: qty 510

## 2020-06-30 ENCOUNTER — Other Ambulatory Visit: Payer: Self-pay | Admitting: Adult Health

## 2020-07-05 ENCOUNTER — Telehealth: Payer: Self-pay | Admitting: Adult Health

## 2020-07-05 NOTE — Telephone Encounter (Signed)
Pt wants note to get the flu shot, will leave at the front desk.

## 2020-07-12 ENCOUNTER — Other Ambulatory Visit: Payer: Self-pay | Admitting: Adult Health

## 2020-07-23 ENCOUNTER — Other Ambulatory Visit: Payer: Self-pay | Admitting: Women's Health

## 2020-07-25 ENCOUNTER — Other Ambulatory Visit: Payer: Self-pay | Admitting: Adult Health

## 2020-08-17 ENCOUNTER — Other Ambulatory Visit: Payer: Self-pay | Admitting: Adult Health

## 2020-10-08 ENCOUNTER — Other Ambulatory Visit: Payer: Self-pay | Admitting: Adult Health

## 2021-03-24 ENCOUNTER — Other Ambulatory Visit (HOSPITAL_COMMUNITY): Payer: Self-pay | Admitting: Adult Health

## 2021-03-24 DIAGNOSIS — Z1231 Encounter for screening mammogram for malignant neoplasm of breast: Secondary | ICD-10-CM

## 2021-04-02 ENCOUNTER — Other Ambulatory Visit: Payer: Self-pay | Admitting: Adult Health

## 2021-04-13 ENCOUNTER — Encounter: Payer: Self-pay | Admitting: Adult Health

## 2021-04-13 ENCOUNTER — Ambulatory Visit (INDEPENDENT_AMBULATORY_CARE_PROVIDER_SITE_OTHER): Payer: BC Managed Care – PPO | Admitting: Adult Health

## 2021-04-13 ENCOUNTER — Other Ambulatory Visit: Payer: Self-pay

## 2021-04-13 VITALS — BP 148/76 | HR 56 | Ht 65.0 in | Wt 105.4 lb

## 2021-04-13 DIAGNOSIS — Z1211 Encounter for screening for malignant neoplasm of colon: Secondary | ICD-10-CM

## 2021-04-13 DIAGNOSIS — Z01419 Encounter for gynecological examination (general) (routine) without abnormal findings: Secondary | ICD-10-CM

## 2021-04-13 DIAGNOSIS — I10 Essential (primary) hypertension: Secondary | ICD-10-CM

## 2021-04-13 DIAGNOSIS — E7800 Pure hypercholesterolemia, unspecified: Secondary | ICD-10-CM

## 2021-04-13 DIAGNOSIS — Z79899 Other long term (current) drug therapy: Secondary | ICD-10-CM | POA: Diagnosis not present

## 2021-04-13 DIAGNOSIS — F419 Anxiety disorder, unspecified: Secondary | ICD-10-CM | POA: Diagnosis not present

## 2021-04-13 DIAGNOSIS — Z79818 Long term (current) use of other agents affecting estrogen receptors and estrogen levels: Secondary | ICD-10-CM

## 2021-04-13 DIAGNOSIS — Z90710 Acquired absence of both cervix and uterus: Secondary | ICD-10-CM

## 2021-04-13 LAB — HEMOCCULT GUIAC POC 1CARD (OFFICE): Fecal Occult Blood, POC: NEGATIVE

## 2021-04-13 MED ORDER — LISINOPRIL 10 MG PO TABS: 10.0000 mg | ORAL_TABLET | Freq: Every day | ORAL | 3 refills | Status: DC

## 2021-04-13 MED ORDER — BUSPIRONE HCL 5 MG PO TABS: 5.0000 mg | ORAL_TABLET | Freq: Two times a day (BID) | ORAL | 5 refills | Status: DC

## 2021-04-13 MED ORDER — ACYCLOVIR 400 MG PO TABS: 400.0000 mg | ORAL_TABLET | Freq: Three times a day (TID) | ORAL | 4 refills | Status: DC

## 2021-04-13 MED ORDER — SIMVASTATIN 10 MG PO TABS: ORAL_TABLET | ORAL | 3 refills | Status: DC

## 2021-04-13 NOTE — Progress Notes (Signed)
Patient ID: [name], female   DOB: [birth year], 60 y.o.   MRN: [id number] History of Present Illness: [name] is a 60 year old white female, married, sp hysterectomy in for a well woman gyn exam. PCP is Dr Hilma Favors.   Current Medications, Allergies, Past Medical History, Past Surgical History, Family History and Social History were reviewed in [employer] record.     Review of Systems: Patient denies any headaches, hearing loss, fatigue, blurred vision, shortness of breath, chest pain, abdominal pain, problems with bowel movements, urination, or intercourse. No joint pain or mood swings.     Physical Exam:BP (!) 148/76 (BP Location: Left Arm, Patient Position: Sitting, Cuff Size: Normal)   Pulse (!) 56   Ht 5' 5"  (1.651 m)   Wt 105 lb 6.4 oz (47.8 kg)   BMI 17.54 kg/m   General:  Well developed, well nourished, no acute distress Skin:  Warm and dry Neck:  Midline trachea, normal thyroid, good ROM, no lymphadenopathy, no carotid bruits heard Lungs; Clear to auscultation bilaterally Breast:  No dominant palpable mass, retraction, or nipple discharge Cardiovascular: Regular rate and rhythm Abdomen:  Soft, non tender, no hepatosplenomegaly Pelvic:  External genitalia is normal in appearance, no lesions.  The vagina is pale. Urethra has no lesions or masses. The cervix and uterus are absent. No adnexal masses or tenderness noted.Bladder is non tender, no masses felt. Rectal: Good sphincter tone, no polyps, or hemorrhoids felt.  Hemoccult negative. Extremities/musculoskeletal:  No swelling or varicosities noted, no clubbing or cyanosis Psych:  No mood changes, alert and cooperative,seems happy AA is 0 Fall risk is low Depression screen [facility] 2/9 04/13/2021 04/07/2020 04/07/2019  Decreased Interest 0 0 0  Down, Depressed, Hopeless 0 0 0  PHQ - 2 Score 0 0 0  Altered sleeping 0 0 -  Tired, decreased energy 0 0 -  Change in appetite 0 0 -  Feeling bad or failure  about yourself  0 0 -  Trouble concentrating 0 0 -  Moving slowly or fidgety/restless 0 0 -  Suicidal thoughts 0 0 -  PHQ-9 Score 0 0 -  Difficult doing work/chores - Not difficult at all -    GAD 7 : Generalized Anxiety Score 04/13/2021 04/07/2020  Nervous, Anxious, on Edge 0 0  Control/stop worrying 0 0  Worry too much - different things 0 0  Trouble relaxing 0 0  Restless 0 0  Easily annoyed or irritable 0 0  Afraid - awful might happen 0 0  Total GAD 7 Score 0 0  Anxiety Difficulty - Not difficult at all      Upstream - 04/13/21 0932       Pregnancy Intention Screening   Does the patient want to become pregnant in the next year? No    Does the patient's partner want to become pregnant in the next year? No    Would the patient like to discuss contraceptive options today? No      Contraception Wrap Up   Current Method Female Sterilization   hysterectomy   End Method Female Sterilization   hysterectomy   Contraception Counseling Provided No            Pt gave verbal consent for exam without chaperone.   Impression and Plan: 1. Encounter for well woman exam with routine gynecological exam Physical in 1 year Check CBC,CMP,TSH and lipids  Mammogram yearly Colonoscopy per GI   2. Essential hypertension Continue lisinopril Meds ordered this encounter  Medications   acyclovir (ZOVIRAX) 400 MG tablet    Sig: Take 1 tablet (400 mg total) by mouth 3 (three) times daily.    Dispense:  90 tablet    Refill:  4    Order Specific Question:   Supervising Provider    Answer:   Elonda Husky, LUTHER H [[id number]]   busPIRone (BUSPAR) 5 MG tablet    Sig: Take 1 tablet (5 mg total) by mouth 2 (two) times daily.    Dispense:  180 tablet    Refill:  5    Order Specific Question:   Supervising Provider    Answer:   Elonda Husky, LUTHER H [[id number]]   lisinopril (ZESTRIL) 10 MG tablet    Sig: Take 1 tablet (10 mg total) by mouth daily.    Dispense:  90 tablet    Refill:  3    Order Specific Question:    Supervising Provider    Answer:   Tania Ade H [[id number]]   simvastatin (ZOCOR) 10 MG tablet    Sig: Take 1 at HS    Dispense:  90 tablet    Refill:  3    Order Specific Question:   Supervising Provider    Answer:   Elonda Husky, LUTHER H [[id number]]    3. Current use of estrogen therapy Continue estratest, has refills   4. Anxiety Continue buspar  5. Elevated cholesterol Continue zocor   6. Encounter for screening fecal occult blood testing   7. S/P hysterectomy

## 2021-04-18 ENCOUNTER — Telehealth: Payer: Self-pay

## 2021-04-18 ENCOUNTER — Other Ambulatory Visit: Payer: Self-pay | Admitting: Women's Health

## 2021-04-18 LAB — COMPREHENSIVE METABOLIC PANEL
ALT: 16 IU/L (ref 0–32)
AST: 21 IU/L (ref 0–40)
Albumin/Globulin Ratio: 1.5 (ref 1.2–2.2)
Albumin: 3.5 g/dL — ABNORMAL LOW (ref 3.8–4.9)
Alkaline Phosphatase: 70 IU/L (ref 44–121)
BUN/Creatinine Ratio: 9 — ABNORMAL LOW (ref 12–28)
BUN: 11 mg/dL (ref 8–27)
Bilirubin Total: <0.2 mg/dL (ref 0.0–1.2)
CO2: 24 mmol/L (ref 20–29)
Calcium: 8.4 mg/dL — ABNORMAL LOW (ref 8.7–10.3)
Chloride: 105 mmol/L (ref 96–106)
Creatinine, Ser: 1.29 mg/dL — ABNORMAL HIGH (ref 0.57–1.00)
Globulin, Total: 2.3 g/dL (ref 1.5–4.5)
Glucose: 92 mg/dL (ref 65–99)
Potassium: 5.2 mmol/L (ref 3.5–5.2)
Sodium: 143 mmol/L (ref 134–144)
Total Protein: 5.8 g/dL — ABNORMAL LOW (ref 6.0–8.5)
eGFR: 48 mL/min/{1.73_m2} — ABNORMAL LOW (ref >59)

## 2021-04-18 LAB — LIPID PANEL
Chol/HDL Ratio: 5.4 ratio — ABNORMAL HIGH (ref 0.0–4.4)
Cholesterol, Total: 118 mg/dL (ref 100–199)
HDL: 22 mg/dL — ABNORMAL LOW (ref >39)
LDL Chol Calc (NIH): 79 mg/dL (ref 0–99)
Triglycerides: 87 mg/dL (ref 0–149)
VLDL Cholesterol Cal: 17 mg/dL (ref 5–40)

## 2021-04-18 LAB — CBC
Hematocrit: 42.3 % (ref 34.0–46.6)
Hemoglobin: 14.1 g/dL (ref 11.1–15.9)
MCH: 32.7 pg (ref 26.6–33.0)
MCHC: 33.3 g/dL (ref 31.5–35.7)
MCV: 98 fL — ABNORMAL HIGH (ref 79–97)
Platelets: 208 10*3/uL (ref 150–450)
RBC: 4.31 x10E6/uL (ref 3.77–5.28)
RDW: 13.6 % (ref 11.7–15.4)
WBC: 5.3 10*3/uL (ref 3.4–10.8)

## 2021-04-18 LAB — TSH: TSH: 0.66 u[IU]/mL (ref 0.450–4.500)

## 2021-04-18 NOTE — Progress Notes (Signed)
Creatinine elevated x few years, call PCP to discuss. HDL low, otherwise labs look good. Notified by Glenard Haring, RN.  Roma Schanz, CNM, WHNP-BC 04/18/2021 2:30 PM

## 2021-04-18 NOTE — Telephone Encounter (Signed)
Called pt and relayed [name] msg regarding lab results. Pt confirmed understanding.

## 2021-04-24 ENCOUNTER — Other Ambulatory Visit: Payer: Self-pay

## 2021-04-24 ENCOUNTER — Ambulatory Visit (HOSPITAL_COMMUNITY)
Admission: RE | Admit: 2021-04-24 | Discharge: 2021-04-24 | Disposition: A | Discharge status: Home / Self Care | Payer: BC Managed Care – PPO | Source: Ambulatory Visit | Attending: Adult Health | Admitting: Adult Health

## 2021-04-24 DIAGNOSIS — Z1231 Encounter for screening mammogram for malignant neoplasm of breast: Secondary | ICD-10-CM | POA: Diagnosis not present

## 2021-04-25 ENCOUNTER — Other Ambulatory Visit (HOSPITAL_COMMUNITY): Payer: Self-pay | Admitting: Adult Health

## 2021-04-25 DIAGNOSIS — R928 Other abnormal and inconclusive findings on diagnostic imaging of breast: Secondary | ICD-10-CM

## 2021-04-25 DIAGNOSIS — R921 Mammographic calcification found on diagnostic imaging of breast: Secondary | ICD-10-CM

## 2021-04-28 ENCOUNTER — Other Ambulatory Visit: Payer: Self-pay

## 2021-04-28 ENCOUNTER — Ambulatory Visit (HOSPITAL_COMMUNITY)
Admission: RE | Admit: 2021-04-28 | Discharge: 2021-04-28 | Disposition: A | Discharge status: Home / Self Care | Payer: BC Managed Care – PPO | Source: Ambulatory Visit | Attending: Adult Health | Admitting: Adult Health

## 2021-04-28 ENCOUNTER — Other Ambulatory Visit: Payer: Self-pay | Admitting: Adult Health

## 2021-04-28 DIAGNOSIS — R928 Other abnormal and inconclusive findings on diagnostic imaging of breast: Secondary | ICD-10-CM | POA: Diagnosis present

## 2021-04-28 DIAGNOSIS — R921 Mammographic calcification found on diagnostic imaging of breast: Secondary | ICD-10-CM

## 2021-04-30 ENCOUNTER — Other Ambulatory Visit: Payer: Self-pay | Admitting: Adult Health

## 2021-05-01 ENCOUNTER — Other Ambulatory Visit (INDEPENDENT_AMBULATORY_CARE_PROVIDER_SITE_OTHER): Payer: Self-pay

## 2021-05-01 DIAGNOSIS — K219 Gastro-esophageal reflux disease without esophagitis: Secondary | ICD-10-CM

## 2021-05-01 MED ORDER — OMEPRAZOLE 40 MG PO CPDR: 40.0000 mg | DELAYED_RELEASE_CAPSULE | Freq: Every day | ORAL | 0 refills | Status: DC

## 2021-05-08 ENCOUNTER — Ambulatory Visit (INDEPENDENT_AMBULATORY_CARE_PROVIDER_SITE_OTHER): Payer: BC Managed Care – PPO | Admitting: Gastroenterology

## 2021-05-08 ENCOUNTER — Encounter (INDEPENDENT_AMBULATORY_CARE_PROVIDER_SITE_OTHER): Payer: Self-pay | Admitting: Gastroenterology

## 2021-05-08 ENCOUNTER — Other Ambulatory Visit: Payer: Self-pay

## 2021-05-08 VITALS — BP 173/71 | HR 59 | Temp 98.2°F | Ht 65.0 in | Wt 105.0 lb

## 2021-05-08 DIAGNOSIS — E611 Iron deficiency: Secondary | ICD-10-CM | POA: Insufficient documentation

## 2021-05-08 DIAGNOSIS — K900 Celiac disease: Secondary | ICD-10-CM

## 2021-05-08 DIAGNOSIS — Z1321 Encounter for screening for nutritional disorder: Secondary | ICD-10-CM

## 2021-05-08 DIAGNOSIS — K219 Gastro-esophageal reflux disease without esophagitis: Secondary | ICD-10-CM

## 2021-05-08 DIAGNOSIS — R634 Abnormal weight loss: Secondary | ICD-10-CM | POA: Diagnosis not present

## 2021-05-08 MED ORDER — OMEPRAZOLE 40 MG PO CPDR: 40.0000 mg | DELAYED_RELEASE_CAPSULE | Freq: Every day | ORAL | 3 refills | Status: DC

## 2021-05-08 MED ORDER — MIRTAZAPINE 7.5 MG PO TABS: 7.5000 mg | ORAL_TABLET | Freq: Every day | ORAL | 0 refills | Status: DC

## 2021-05-08 NOTE — Patient Instructions (Addendum)
Strict gluten free diet, try to eat three meals and snacks every day Start Remeron 7.5 mg  every night Can take Boost  or Ensure (gluten free) up to three times per day Continue omeprazole 40 mg qday Can take over the counter Pepcid as needed for breakthrough episodes of heartburn Perform blood workup Perform bone scan

## 2021-05-08 NOTE — Progress Notes (Signed)
Maylon Peppers, M.D. Gastroenterology & Hepatology [facility] [address] [place], [place] [address]  Primary Care Physician: Sharilyn Sites, MD [address] [place] [address]  I will communicate my assessment and recommendations to the referring MD via EMR.  Problems: Celiac disease GERD  History of Present Illness: [name] is a 60 y.o. female with past medical history of GERD, celiac disease, anxiety, hypertension, who presents for follow up of GERD and celiac disease.  The patient was last seen on 05/09/2020. At that time, the patient was advised to follow compliantly her gluten-free diet but also take ferrous sulfate 325 mg every day.  She was ordered celiac disease panel but did not have this performed.  Patient reports that she takes omeprazole 40 mg every day in the AM compliantly. She has breaktrough episodes once or twice a week only when she eats spicy food. No dysphagia or odynophagia.  Patient reported even though she has tried she has not been able to gain weight.  In fact, she has lost 11 lb since last time seen in the office. She stopepd her gluten free diet a while a go as she was not able to gain any weight.  Nevertheless, even by liberalizing her diet she has not gained any weight at all.  She is only eating once a day as she has not felt too much appetite, which she reports has been ongoing for a long time. The patient denies having any nausea, vomiting, fever, chills, hematochezia, melena, hematemesis, abdominal distention, abdominal pain, diarrhea, jaundice, pruritus. No night sweats.  Has 2 soft Bms daily.  Her most recent blood work-up from 04/17/2021 showed normal hemoglobin of 14.1 with MCV of 98, platelets of 208 and although cell count of 5.3.  Her most recent iron stores from 05/09/2020 showed a decrease ferritin of 7, saturation of 7% and iron of 23 with TIBC increase of 348.  Last EGD: 2018   - Normal esophagus. - Z-line irregular, 36 cm from the incisors. - 2 cm hiatal hernia. - Gastritis. Biopsied, neg for HP. - Duodenitis. - Duodenal diverticulum. - Normal second portion of the duodenum. Biopsied, consistent with celiac disease Last Colonoscopy: 2015 Prep excellent. Normal mucosa of the cecum, ascending colon, hepatic flexure, transverse colon, splenic flexure, descending and sigmoid colon. Normal mucosa of rectum. Hemorrhoids noted above and below the dentate line.  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   Dysuria 01/19/2014   Fibrocystic breast changes 01/11/2016   GERD (gastroesophageal reflux disease)    occ   H/O estrogen therapy    Hypertension     Past Surgical History: Past Surgical History:  Procedure Laterality Date   APPENDECTOMY  2007   had with bso   BACK SURGERY     L4 & L5   BILATERAL SALPINGOOPHORECTOMY  2007   BIOPSY N/A 01/17/2017   Procedure: BIOPSY;  Surgeon: [name], MD;  [facility]: [facility];  Service: Endoscopy;  Laterality: N/A;   CARPAL TUNNEL RELEASE Right    has had 3x   CESAREAN SECTION     COLONOSCOPY N/A 01/13/2014   Procedure: COLONOSCOPY;  Surgeon: [name], MD;  Location: [facility];  Service: Endoscopy;  Laterality: N/A;  930   ESOPHAGOGASTRODUODENOSCOPY N/A 01/17/2017   Procedure: ESOPHAGOGASTRODUODENOSCOPY (EGD);  Surgeon: [name], MD;  Location: [facility];  Service: Endoscopy;  Laterality: N/A;  2:10   NECK SURGERY  12/2010   TOTAL ABDOMINAL HYSTERECTOMY  07/2005    for menorrhagia and ut. fibroids    Family History: Family History  Problem Relation Age of Onset   Parkinsonism Father    Alzheimer's disease Father    Heart attack Brother     Diabetes Sister        borderline   Cancer Sister        kidney   Hypertension Sister    COPD Mother    Hypertension Brother    Colon cancer Neg Hx     Social History: Social History   Tobacco Use  Smoking Status Former   Packs/day: 0.25   Years: 5.00   Pack years: 1.25   Types: Cigarettes   Quit date: 09/11/1996   Years since quitting: 24.6  Smokeless Tobacco Never   Social History   Substance and Sexual Activity  Alcohol Use No   Social History   Substance and Sexual Activity  Drug Use No    Allergies: Allergies  Allergen Reactions   No Known Allergies     Medications: Current Outpatient Medications  Medication Sig Dispense Refill   acyclovir (ZOVIRAX) 400 MG tablet TAKE 1 TABLET BY MOUTH 3 TIMES DAILY. 270 tablet 1   busPIRone (BUSPAR) 5 MG tablet Take 1 tablet (5 mg total) by mouth 2 (two) times daily. 180 tablet 5   estrogens-methylTEST (ESTRATEST) 1.25-2.5 MG tablet TAKE 1 TABLET BY MOUTH EVERY DAY 30 tablet 5   lisinopril (ZESTRIL) 10 MG tablet Take 1 tablet (10 mg total) by mouth daily. 90 tablet 3   omeprazole (PRILOSEC) 40 MG capsule Take 1 capsule (40 mg total) by mouth daily. 90 capsule 0   Pediatric Multivitamins-Iron (FLINTSTONES PLUS IRON) chewable tablet Chew 1 tablet by mouth 2 (two) times daily.     simvastatin (ZOCOR) 10 MG tablet Take 1 at HS 90 tablet 3   No current facility-administered medications for this visit.    Review of Systems: GENERAL: negative for malaise, night sweats HEENT: No changes in hearing or vision, no nose bleeds or other nasal problems. NECK: Negative for lumps, goiter, pain and significant neck swelling RESPIRATORY: Negative for cough, wheezing CARDIOVASCULAR: Negative for chest pain, leg swelling, palpitations, orthopnea GI: SEE HPI MUSCULOSKELETAL: Negative for joint pain or swelling, back pain, and muscle pain. SKIN: Negative for lesions, rash PSYCH: Negative for sleep disturbance, mood disorder and recent  psychosocial stressors. HEMATOLOGY Negative for prolonged bleeding, bruising easily, and swollen nodes. ENDOCRINE: Negative for cold or heat intolerance, polyuria, polydipsia and goiter. NEURO: negative for tremor, gait imbalance, syncope and seizures. The remainder of the review of systems is noncontributory.   Physical Exam: BP (!) 173/71 (BP Location: Right Arm, Patient Position: Sitting, Cuff Size: Normal)   Pulse (!) 59   Temp 98.2 F (36.8 C) (Oral)   Ht 5' 5"  (1.651 m)   Wt 105 lb (47.6 kg)   BMI 17.47 kg/m  GENERAL: The patient is AO x3, in no acute distress.  Underweight HEENT: Head is normocephalic and atraumatic. EOMI are intact. Mouth is well hydrated and without lesions. NECK: Supple. No masses LUNGS: Clear to auscultation. No presence of rhonchi/wheezing/rales. Adequate chest expansion HEART: RRR, normal s1 and s2. ABDOMEN: Soft, nontender, no guarding, no peritoneal signs, and nondistended.  BS +. No masses. EXTREMITIES: Without any cyanosis, clubbing, rash, lesions or edema. NEUROLOGIC: AOx3, no focal motor deficit. SKIN: no jaundice, no rashes  Imaging/Labs: as above  I personally reviewed and interpreted the available labs, imaging and endoscopic files.  Impression and Plan: [name] is a 60 y.o. female with past medical history of GERD, celiac disease, anxiety, hypertension, who presents for follow up of GERD and celiac disease.  The patient has presented significant weight loss through the years which is likely related to decreased oral intake.  Has not presented any other symptoms or alterations concerning for malignancy at the moment, however had decreased intake is likely driving her malnutrition.  I encouraged her to take protein shakes and to follow a strictly gluten-free diet as this may improve her malabsorption.  Her appetite may improve with the use of Remeron which I will start a low-dose of 7.5 mg every night. Regarding her celiac disease, I will  recheck her celiac serologies, CMP, CBC vitamin B12/D, folate and iron stores today.  I emphasized the importance of adhering to a gluten-free diet to avoid chronic malabsorption but also to decrease her risk of gastrointestinal malignancy and lymphoma.  We will also check her bone density with a DEXA scan as she has never had 1 in the past. In terms of her GERD, she has presented relatively good control of her symptoms but she presents only symptoms when she eats spicy food.  I advised her to take famotidine as needed if she presents these episodes.  She will continue taking her omeprazole 40 mg every day  -Strict gluten free diet, try to eat three meals and snacks every day -Start Remeron 7.5 mg  every night -Can take Boost  or Ensure (gluten free) up to three times per day -Continue omeprazole 40 mg qday -Can take over the counter Pepcid as needed for breakthrough episodes of heartburn - Check celiac serologies, CMP, CBC vitamin B12/D, folate and iron stores today -Perform DEXA scan - RTC 3 months  All questions were answered.      Harvel Quale, MD Gastroenterology and Hepatology [facility]

## 2021-05-10 ENCOUNTER — Ambulatory Visit
Admission: RE | Admit: 2021-05-10 | Discharge: 2021-05-10 | Disposition: A | Discharge status: Home / Self Care | Payer: BC Managed Care – PPO | Source: Ambulatory Visit | Attending: Adult Health | Admitting: Adult Health

## 2021-05-10 ENCOUNTER — Other Ambulatory Visit: Payer: Self-pay

## 2021-05-10 DIAGNOSIS — R921 Mammographic calcification found on diagnostic imaging of breast: Secondary | ICD-10-CM

## 2021-05-11 ENCOUNTER — Telehealth (INDEPENDENT_AMBULATORY_CARE_PROVIDER_SITE_OTHER): Payer: Self-pay | Admitting: *Deleted

## 2021-05-12 ENCOUNTER — Telehealth (INDEPENDENT_AMBULATORY_CARE_PROVIDER_SITE_OTHER): Payer: Self-pay | Admitting: *Deleted

## 2021-05-12 NOTE — Telephone Encounter (Signed)
Ok that's ok, she can hold off on the vitamin b12

## 2021-05-12 NOTE — Telephone Encounter (Signed)
Pt called and wanted b12 canceled on labs due to insurance not covering. Codes given were iron def, celiac screen, and encounter for vit def screen. I looked in chart. Last b12 in chart was normal and I could not find any other diagnosis to add.

## 2021-05-15 LAB — CBC WITH DIFFERENTIAL/PLATELET
Absolute Monocytes: 359 cells/uL (ref 200–950)
Basophils Absolute: 68 cells/uL (ref 0–200)
Basophils Relative: 1.2 %
Eosinophils Absolute: 228 cells/uL (ref 15–500)
Eosinophils Relative: 4 %
HCT: 44.5 % (ref 35.0–45.0)
Hemoglobin: 14.8 g/dL (ref 11.7–15.5)
Lymphs Abs: 1744 cells/uL (ref 850–3900)
MCH: 32.4 pg (ref 27.0–33.0)
MCHC: 33.3 g/dL (ref 32.0–36.0)
MCV: 97.4 fL (ref 80.0–100.0)
MPV: 11.3 fL (ref 7.5–12.5)
Monocytes Relative: 6.3 %
Neutro Abs: 3300 cells/uL (ref 1500–7800)
Neutrophils Relative %: 57.9 %
Platelets: 197 10*3/uL (ref 140–400)
RBC: 4.57 10*6/uL (ref 3.80–5.10)
RDW: 12.4 % (ref 11.0–15.0)
Total Lymphocyte: 30.6 %
WBC: 5.7 10*3/uL (ref 3.8–10.8)

## 2021-05-15 LAB — COMPREHENSIVE METABOLIC PANEL
AG Ratio: 1.7 (calc) (ref 1.0–2.5)
ALT: 18 U/L (ref 6–29)
AST: 22 U/L (ref 10–35)
Albumin: 3.9 g/dL (ref 3.6–5.1)
Alkaline phosphatase (APISO): 66 U/L (ref 37–153)
BUN/Creatinine Ratio: 10 (calc) (ref 6–22)
BUN: 11 mg/dL (ref 7–25)
CO2: 30 mmol/L (ref 20–32)
Calcium: 8.9 mg/dL (ref 8.6–10.4)
Chloride: 104 mmol/L (ref 98–110)
Creat: 1.15 mg/dL — ABNORMAL HIGH (ref 0.50–1.05)
Globulin: 2.3 g/dL (calc) (ref 1.9–3.7)
Glucose, Bld: 91 mg/dL (ref 65–99)
Potassium: 4.3 mmol/L (ref 3.5–5.3)
Sodium: 139 mmol/L (ref 135–146)
Total Bilirubin: 0.4 mg/dL (ref 0.2–1.2)
Total Protein: 6.2 g/dL (ref 6.1–8.1)

## 2021-05-15 LAB — CELIAC DISEASE PANEL
(tTG) Ab, IgA: >250 U/mL — ABNORMAL HIGH
(tTG) Ab, IgG: 139.2 U/mL — ABNORMAL HIGH
Gliadin IgA: 14 U/mL
Gliadin IgG: 12.3 U/mL
Immunoglobulin A: 268 mg/dL (ref 47–310)

## 2021-05-15 LAB — FERRITIN: Ferritin: 28 ng/mL (ref 16–232)

## 2021-05-15 LAB — IRON, TOTAL/TOTAL IRON BINDING CAP
%SAT: 24 % (calc) (ref 16–45)
Iron: 67 ug/dL (ref 45–160)
TIBC: 274 mcg/dL (calc) (ref 250–450)

## 2021-05-15 LAB — VITAMIN D 25 HYDROXY (VIT D DEFICIENCY, FRACTURES): Vit D, 25-Hydroxy: 37 ng/mL (ref 30–100)

## 2021-05-15 NOTE — Telephone Encounter (Signed)
Lab was notified and also pt notified to let lab know when she goes to cancel b12.

## 2021-05-15 NOTE — Telephone Encounter (Signed)
error 

## 2021-05-24 ENCOUNTER — Ambulatory Visit (HOSPITAL_COMMUNITY)
Admission: RE | Admit: 2021-05-24 | Discharge: 2021-05-24 | Disposition: A | Discharge status: Home / Self Care | Payer: BC Managed Care – PPO | Source: Ambulatory Visit | Attending: Gastroenterology | Admitting: Gastroenterology

## 2021-05-24 ENCOUNTER — Other Ambulatory Visit (INDEPENDENT_AMBULATORY_CARE_PROVIDER_SITE_OTHER): Payer: Self-pay | Admitting: Gastroenterology

## 2021-05-24 ENCOUNTER — Other Ambulatory Visit: Payer: Self-pay

## 2021-05-24 DIAGNOSIS — K900 Celiac disease: Secondary | ICD-10-CM | POA: Diagnosis not present

## 2021-05-24 DIAGNOSIS — M818 Other osteoporosis without current pathological fracture: Secondary | ICD-10-CM

## 2021-05-24 MED ORDER — ALENDRONATE SODIUM 70 MG PO TABS: 70.0000 mg | ORAL_TABLET | ORAL | 0 refills | Status: DC

## 2021-05-24 MED ORDER — CALCIUM CARBONATE-VITAMIN D 500-200 MG-UNIT PO TABS: 1.0000 | ORAL_TABLET | Freq: Every day | ORAL | 0 refills | Status: AC

## 2021-06-17 ENCOUNTER — Other Ambulatory Visit (INDEPENDENT_AMBULATORY_CARE_PROVIDER_SITE_OTHER): Payer: Self-pay | Admitting: Gastroenterology

## 2021-06-17 DIAGNOSIS — M818 Other osteoporosis without current pathological fracture: Secondary | ICD-10-CM

## 2021-06-19 NOTE — Telephone Encounter (Signed)
Last seen 05/08/21

## 2021-06-19 NOTE — Telephone Encounter (Signed)
Per Dr. Jenetta Downer note on bone density test done august of this year.    I will start her on Fosamax 70 mg every week and vitamin D/calcium supplementation daily.  She will need to follow-up with her PCP for further refills.

## 2021-06-20 NOTE — Telephone Encounter (Signed)
Called and discussed with pt to have primary care refill med. She states she no longer has a pcp but trying to get back in with dr Hilma Favors. States she went too long without seeing him and now she has to be reestablished.she has contacted the office and waiting to hear back. Asked if one  month could be sent in since she is out to give her time to get in with pcp.

## 2021-06-21 ENCOUNTER — Other Ambulatory Visit (INDEPENDENT_AMBULATORY_CARE_PROVIDER_SITE_OTHER): Payer: Self-pay | Admitting: Gastroenterology

## 2021-06-21 DIAGNOSIS — M818 Other osteoporosis without current pathological fracture: Secondary | ICD-10-CM

## 2021-06-21 MED ORDER — ALENDRONATE SODIUM 70 MG PO TABS
70.0000 mg | ORAL_TABLET | ORAL | 0 refills | Status: AC
Start: 2021-06-21 — End: ?

## 2021-06-21 NOTE — Progress Notes (Signed)
Refill sent for fosamax 21m once weekly. Patient aware that PCP needs to be the refilling provider after this month's supply, however, she is attempting to reestablish with new PCP so I am fine to refill this one time.

## 2021-06-21 NOTE — Telephone Encounter (Signed)
Pt called and notified that one month supply sent in and further refills will need to come from primary care. Pt verbalized understanding.

## 2021-07-13 ENCOUNTER — Other Ambulatory Visit (INDEPENDENT_AMBULATORY_CARE_PROVIDER_SITE_OTHER): Payer: Self-pay | Admitting: Gastroenterology

## 2021-07-13 DIAGNOSIS — M818 Other osteoporosis without current pathological fracture: Secondary | ICD-10-CM

## 2021-07-17 NOTE — Telephone Encounter (Signed)
No, she needs to follow with PCP for further refills

## 2021-07-17 NOTE — Telephone Encounter (Signed)
Tried to call and no answer. Vm not set up yet.

## 2021-07-19 NOTE — Telephone Encounter (Signed)
Patient aware and states she has an upcoming appointment with Pcp.

## 2021-08-03 ENCOUNTER — Other Ambulatory Visit (INDEPENDENT_AMBULATORY_CARE_PROVIDER_SITE_OTHER): Payer: Self-pay | Admitting: Gastroenterology

## 2021-08-03 DIAGNOSIS — R634 Abnormal weight loss: Secondary | ICD-10-CM

## 2021-08-03 NOTE — Telephone Encounter (Signed)
Last seen 05/08/2021 for celiac.

## 2021-08-18 ENCOUNTER — Other Ambulatory Visit (INDEPENDENT_AMBULATORY_CARE_PROVIDER_SITE_OTHER): Payer: Self-pay | Admitting: Gastroenterology

## 2021-08-18 DIAGNOSIS — M818 Other osteoporosis without current pathological fracture: Secondary | ICD-10-CM

## 2021-08-21 NOTE — Telephone Encounter (Signed)
Last seen 05/08/2021 for Celiac, next appointment 08/31/2021

## 2021-08-21 NOTE — Telephone Encounter (Signed)
This medication should be refilled by her primary care physician.

## 2021-08-31 ENCOUNTER — Encounter (INDEPENDENT_AMBULATORY_CARE_PROVIDER_SITE_OTHER): Payer: Self-pay | Admitting: Gastroenterology

## 2021-08-31 ENCOUNTER — Other Ambulatory Visit: Payer: Self-pay

## 2021-08-31 ENCOUNTER — Ambulatory Visit (INDEPENDENT_AMBULATORY_CARE_PROVIDER_SITE_OTHER): Payer: BC Managed Care – PPO | Admitting: Gastroenterology

## 2021-08-31 VITALS — BP 165/73 | HR 66 | Temp 98.2°F | Ht 65.0 in | Wt 104.6 lb

## 2021-08-31 DIAGNOSIS — E440 Moderate protein-calorie malnutrition: Secondary | ICD-10-CM

## 2021-08-31 DIAGNOSIS — K900 Celiac disease: Secondary | ICD-10-CM | POA: Diagnosis not present

## 2021-08-31 DIAGNOSIS — E46 Unspecified protein-calorie malnutrition: Secondary | ICD-10-CM | POA: Insufficient documentation

## 2021-08-31 DIAGNOSIS — R634 Abnormal weight loss: Secondary | ICD-10-CM

## 2021-08-31 MED ORDER — MIRTAZAPINE 7.5 MG PO TABS: 7.5000 mg | ORAL_TABLET | Freq: Every day | ORAL | 3 refills | Status: AC

## 2021-08-31 NOTE — Progress Notes (Signed)
Maylon Peppers, M.D. Gastroenterology & Hepatology [facility] [address] [place], [place] [address]  Primary Care Physician: Sharilyn Sites, MD [address] [place] [address]  I will communicate my assessment and recommendations to the referring MD via EMR.  Problems: Celiac disease Protein caloric malnutrition GERD  History of Present Illness: [name] is a 60 y.o. female  with past medical history of GERD, celiac disease, anxiety, hypertension, who presents for follow up of GERD and celiac disease.  The patient was last seen on 05/08/2021. At that time, the patient was advised to follow a gluten free diet. Serology was checked and TTG came back elevated. She was started on Remeron 7.5 mg due to poor appetite. She was also found to have osteoporosis on DEXA scan and was started on Fosamax. She was continued on omeprazole 40 mg qday. Was also adivsed to take protein shakes daily.  Patient states she has been feeling well and has been eating more since mirtazapine was started in 05/2021. Her appetite has been increasing with the intake of mirtazapine. Her weight is similar to her last Appointment, possibly one less pound but no further major weight loss as in the past.  Has been trying got adhere to a gluten free diet as much as possible and denies "cheating by eating food that is not allowed". The patient denies having any nausea, vomiting, fever, chills, hematochezia, melena, hematemesis, abdominal distention, abdominal pain, diarrhea, jaundice, pruritus.  She has previously seen a dietitian. Currently takes gluten free Ensure once a day.  Last EGD: 2018  - Normal esophagus. - Z-line irregular, 36 cm from the incisors. - 2 cm hiatal hernia. - Gastritis. Biopsied, neg for HP. - Duodenitis. - Duodenal diverticulum. - Normal second portion of the duodenum. Biopsied, consistent with celiac disease Last Colonoscopy:  2015 Prep excellent. Normal mucosa of the cecum, ascending colon, hepatic flexure, transverse colon, splenic flexure, descending and sigmoid colon. Normal mucosa of rectum. Hemorrhoids noted above and below the dentate line.  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   Dysuria 01/19/2014   Fibrocystic breast changes 01/11/2016   GERD (gastroesophageal reflux disease)    occ   H/O estrogen therapy    Hypertension     Past Surgical History: Past Surgical History:  Procedure Laterality Date   APPENDECTOMY  2007   had with bso   BACK SURGERY     L4 & L5   BILATERAL SALPINGOOPHORECTOMY  2007   BIOPSY N/A 01/17/2017   Procedure: BIOPSY;  Surgeon: [name], MD;  [facility];  Service: Endoscopy;  Laterality: N/A;   CARPAL TUNNEL RELEASE Right    has had 3x   CESAREAN SECTION     COLONOSCOPY N/A 01/13/2014   Procedure: COLONOSCOPY;  Surgeon: [name], MD;  Location: [facility];  Service: Endoscopy;  Laterality: N/A;  930   ESOPHAGOGASTRODUODENOSCOPY N/A 01/17/2017   Procedure: ESOPHAGOGASTRODUODENOSCOPY (EGD);  Surgeon: [name], MD;  Location: [facility];  Service: Endoscopy;  Laterality: N/A;  2:10   NECK SURGERY  12/2010   TOTAL ABDOMINAL HYSTERECTOMY  07/2005    for menorrhagia and ut. fibroids    Family History: Family History  Problem Relation Age of Onset   Parkinsonism Father    Alzheimer's disease Father    Heart attack Brother    Diabetes Sister        borderline   Cancer Sister        kidney   Hypertension Sister    COPD Mother    Hypertension Brother    Colon cancer Neg Hx     Social History: Social History   Tobacco Use  Smoking Status Former   Packs/day: 0.25   Years: 5.00   Pack  years: 1.25   Types: Cigarettes   Quit date: 09/11/1996   Years since quitting: 24.9  Smokeless Tobacco Never   Social History   Substance and Sexual Activity  Alcohol Use No   Social History   Substance and Sexual Activity  Drug Use No    Allergies: Allergies  Allergen Reactions   No Known Allergies     Medications: Current Outpatient Medications  Medication Sig Dispense Refill   acyclovir (ZOVIRAX) 400 MG tablet TAKE 1 TABLET BY MOUTH 3 TIMES DAILY. 270 tablet 1   alendronate (FOSAMAX) 70 MG tablet Take 1 tablet (70 mg total) by mouth once a week. Take with a full glass of water on an empty stomach. 4 tablet 0   calcium-vitamin D (OSCAL WITH D) 500-200 MG-UNIT tablet Take 1 tablet by mouth daily with breakfast. 30 tablet 0   estrogens-methylTEST (ESTRATEST) 1.25-2.5 MG tablet TAKE 1 TABLET BY MOUTH EVERY DAY 30 tablet 5   lisinopril (ZESTRIL) 10 MG tablet Take 1 tablet (10 mg total) by mouth daily. 90 tablet 3   mirtazapine (REMERON) 7.5 MG tablet TAKE 1 TABLET BY MOUTH AT BEDTIME. 90 tablet 3   omeprazole (PRILOSEC) 40 MG capsule Take 1 capsule (40 mg total) by mouth daily. 90 capsule 3   Pediatric Multivitamins-Iron (FLINTSTONES PLUS IRON) chewable tablet Chew 1 tablet by mouth 2 (two) times daily.     simvastatin (ZOCOR) 10 MG tablet Take 1 at HS 90 tablet 3   No current facility-administered medications for this visit.    Review of Systems: GENERAL: negative for malaise, night sweats HEENT: No changes in hearing or vision, no nose bleeds or other nasal problems. NECK: Negative for lumps, goiter, pain and significant neck swelling RESPIRATORY: Negative for cough, wheezing CARDIOVASCULAR: Negative for chest pain, leg swelling, palpitations, orthopnea GI: SEE HPI MUSCULOSKELETAL: Negative for joint pain or swelling, back pain, and muscle pain. SKIN: Negative for lesions, rash PSYCH: Negative for sleep disturbance, mood disorder and recent psychosocial  stressors. HEMATOLOGY Negative for prolonged bleeding, bruising easily, and swollen nodes. ENDOCRINE: Negative for cold or heat intolerance, polyuria, polydipsia and goiter. NEURO: negative for tremor, gait imbalance, syncope and seizures. The remainder of the review of systems is noncontributory.   Physical Exam: BP (!) 165/73 (BP Location: Left Arm, Patient Position: Sitting, Cuff Size: Small)   Pulse 66   Temp 98.2 F (36.8 C) (Oral)   Ht 5' 5"  (1.651 m)   Wt 104 lb 9.6 oz (47.4 kg)   BMI 17.41 kg/m  GENERAL: The patient is AO x3, in no acute distress. Underweight. HEENT: Head is normocephalic and atraumatic. EOMI are intact. Mouth is well hydrated and without lesions. NECK: Supple. No masses LUNGS: Clear to auscultation. No presence of rhonchi/wheezing/rales. Adequate chest expansion HEART: RRR, normal s1 and s2. ABDOMEN: Soft, nontender, no guarding, no peritoneal signs, and nondistended. BS +. No masses. EXTREMITIES: Without any cyanosis, clubbing, rash, lesions or edema. NEUROLOGIC:  AOx3, no focal motor deficit. SKIN: no jaundice, no rashes  Imaging/Labs: as above  I personally reviewed and interpreted the available labs, imaging and endoscopic files.  Impression and Plan: [name] is a 60 y.o. female  with past medical history of GERD, celiac disease, anxiety, hypertension, who presents for follow up of GERD and celiac disease.  The patient has tried to maintain a strict gluten-free diet and actually has felt better after she started mirtazapine.  Has not lost any more significant amount of weight and has increased her oral intake of food.  We will evaluate if she has been able to achieve complete avoidance of gluten with repeat serology.  If this has been achieved, we may consider doing an EGD at the end of 2023.  If she is still having positive titers, may need to refer her back to a dietitian as it is important to evaluate whether she has achieved complete avoidance  of cross-contamination.  Finally, if this fails to improve her titers and/or she presents worsening weight loss we may need to repeat an EGD or performed capsule endoscopy to evaluate for lymphoma as a complication of celiac disease.  -Continue strict gluten free diet -Check CBC, CMP, iron panel and celiac serologies -Continue mirtazapine every night -Increase Ensure to two or three times a day -May perform Egd in 08/2022 if serologies are normal -Continue Fosamax  All questions were answered.      Harvel Quale, MD Gastroenterology and Hepatology [facility]

## 2021-08-31 NOTE — Patient Instructions (Addendum)
Continue strict gluten free diet Perform blood workup Continue mirtazapine every night Increase Ensure to two or three times a day May perform Egd in 08/2022 if serologies are normal Continue Fosamax

## 2021-09-01 LAB — CBC WITH DIFFERENTIAL/PLATELET
Absolute Monocytes: 455 cells/uL (ref 200–950)
Basophils Absolute: 61 cells/uL (ref 0–200)
Basophils Relative: 0.6 %
Eosinophils Absolute: 152 cells/uL (ref 15–500)
Eosinophils Relative: 1.5 %
HCT: 42.5 % (ref 35.0–45.0)
Hemoglobin: 14.2 g/dL (ref 11.7–15.5)
Lymphs Abs: 2283 cells/uL (ref 850–3900)
MCH: 32.4 pg (ref 27.0–33.0)
MCHC: 33.4 g/dL (ref 32.0–36.0)
MCV: 97 fL (ref 80.0–100.0)
MPV: 11.1 fL (ref 7.5–12.5)
Monocytes Relative: 4.5 %
Neutro Abs: 7151 cells/uL (ref 1500–7800)
Neutrophils Relative %: 70.8 %
Platelets: 220 10*3/uL (ref 140–400)
RBC: 4.38 10*6/uL (ref 3.80–5.10)
RDW: 12.8 % (ref 11.0–15.0)
Total Lymphocyte: 22.6 %
WBC: 10.1 10*3/uL (ref 3.8–10.8)

## 2021-09-01 LAB — COMPREHENSIVE METABOLIC PANEL
AG Ratio: 1.4 (calc) (ref 1.0–2.5)
ALT: 27 U/L (ref 6–29)
AST: 32 U/L (ref 10–35)
Albumin: 3.8 g/dL (ref 3.6–5.1)
Alkaline phosphatase (APISO): 72 U/L (ref 37–153)
BUN: 15 mg/dL (ref 7–25)
CO2: 29 mmol/L (ref 20–32)
Calcium: 8.8 mg/dL (ref 8.6–10.4)
Chloride: 105 mmol/L (ref 98–110)
Creat: 0.93 mg/dL (ref 0.50–1.05)
Globulin: 2.7 g/dL (calc) (ref 1.9–3.7)
Glucose, Bld: 76 mg/dL (ref 65–99)
Potassium: 4.1 mmol/L (ref 3.5–5.3)
Sodium: 143 mmol/L (ref 135–146)
Total Bilirubin: 0.2 mg/dL (ref 0.2–1.2)
Total Protein: 6.5 g/dL (ref 6.1–8.1)

## 2021-09-01 LAB — CELIAC DISEASE PANEL
(tTG) Ab, IgA: >250 U/mL — ABNORMAL HIGH
(tTG) Ab, IgG: 88.2 U/mL — ABNORMAL HIGH
Gliadin IgA: 51.9 U/mL — ABNORMAL HIGH
Gliadin IgG: 47 U/mL — ABNORMAL HIGH
Immunoglobulin A: 276 mg/dL (ref 47–310)

## 2021-09-01 LAB — IRON, TOTAL/TOTAL IRON BINDING CAP
%SAT: 18 % (calc) (ref 16–45)
Iron: 55 ug/dL (ref 45–160)
TIBC: 302 mcg/dL (calc) (ref 250–450)

## 2021-09-01 LAB — FERRITIN: Ferritin: 23 ng/mL (ref 16–232)

## 2021-09-07 ENCOUNTER — Encounter: Payer: BC Managed Care – PPO | Attending: Gastroenterology | Admitting: Nutrition

## 2021-09-07 ENCOUNTER — Other Ambulatory Visit: Payer: Self-pay

## 2021-09-07 VITALS — Ht 65.0 in | Wt 102.4 lb

## 2021-09-07 DIAGNOSIS — K900 Celiac disease: Secondary | ICD-10-CM

## 2021-09-07 DIAGNOSIS — E441 Mild protein-calorie malnutrition: Secondary | ICD-10-CM

## 2021-09-07 DIAGNOSIS — I10 Essential (primary) hypertension: Secondary | ICD-10-CM

## 2021-09-07 DIAGNOSIS — E611 Iron deficiency: Secondary | ICD-10-CM

## 2021-09-07 DIAGNOSIS — R634 Abnormal weight loss: Secondary | ICD-10-CM

## 2021-09-07 NOTE — Progress Notes (Signed)
Medical Nutrition Therapy  Appointment Start time:  [phone]  Appointment End time:  1645  Primary concerns today: Celiac disease, weight loss  Referral diagnosis: K90, R63.4 Preferred learning style: no preference.  Learning readiness: .ready    NUTRITION ASSESSMENT   No longer craving ice since iron infusions. No black stools. No gas, bloating or stomach distentions. Wants to gain weight to about 120 lbs.  Drinking 3 boots per day-Gluten free  Anthropometrics  Wt Readings from Last 3 Encounters:  08/31/21 104 lb 9.6 oz (47.4 kg)  05/08/21 105 lb (47.6 kg)  04/13/21 105 lb 6.4 oz (47.8 kg)   Ht Readings from Last 3 Encounters:  08/31/21 5' 5"  (1.651 m)  05/08/21 5' 5"  (1.651 m)  04/13/21 5' 5"  (1.651 m)   There is no height or weight on file to calculate BMI. @BMIFA @ Facility age limit for growth percentiles is 20 years. Facility age limit for growth percentiles is 20 years.    Clinical Medical Hx: Celiac disease, HTN, GERD, Anemia Medications: see chart Labs: CBC    Component Value Date/Time   WBC 10.1 08/31/2021 1418   RBC 4.38 08/31/2021 1418   HGB 14.2 08/31/2021 1418   HGB 14.1 04/17/2021 0820   HCT 42.5 08/31/2021 1418   HCT 42.3 04/17/2021 0820   PLT 220 08/31/2021 1418   PLT 208 04/17/2021 0820   MCV 97.0 08/31/2021 1418   MCV 98 (H) 04/17/2021 0820   MCH 32.4 08/31/2021 1418   MCHC 33.4 08/31/2021 1418   RDW 12.8 08/31/2021 1418   RDW 13.6 04/17/2021 0820   LYMPHSABS 2,283 08/31/2021 1418   MONOABS 568 01/09/2017 0934   EOSABS 152 08/31/2021 1418   BASOSABS 61 08/31/2021 1418    Notable Signs/Symptoms: weak, no energy. Tired  Lifestyle & Dietary Hx   Estimated daily fluid intake: 24 oz Supplements: Calcium with Vit D,Estrogen, MVI, Sleep: good Stress / self-care: health issues Current average weekly physical activity: ADL  24-Hr Dietary Recall First Meal: skips or a Boost. Eats 2-3 meals per day, sometimes small frequent meals. Not  hungry often. Drinks Boost 3 times per day.  Estimated Energy Needs Calories: 1800-2000 Carbohydrate: 200g Protein: 135g Fat: 50g   NUTRITION DIAGNOSIS  NI-5.4 Decreased nutrient needs (specify): of Gluten As related to Celiac disease.  As evidenced by elevated GI labs. .   Latest Reference Range & Units Most Recent  Deamidated Gliadin Abs, IgG U/mL 47.0 (H) 08/31/21 14:18  Gliadin IgA U/mL 51.9 (H) 08/31/21 14:18  Immunoglobulin A 47 - 310 mg/dL 276 08/31/21 14:18  (tTG) Ab, IgA U/mL >250.0 (H) 08/31/21 14:18  (tTG) Ab, IgG U/mL 88.2 (H) 08/31/21 14:18  (H): Data is abnormally high   NUTRITION INTERVENTION  Nutrition education (E-1) on the following topics:  Nutrition and Celiac disease. Effect of Gluten on GI track, lack of absorption, GI symptoms, risk for anemia. Reading food labels, portion sizes Importance of meal planning and having meals at home more. Importance of elimination diet and strict restrictions.  Handouts Provided Include  Celiac Disease handout Reading food labels My Plant  based meal plan Nutrition lifestyle  Learning Style & Readiness for Change Teaching method utilized: Visual & Auditory  Demonstrated degree of understanding via: Teach Back  Barriers to learning/adherence to lifestyle change: none  Goals Established by Pt Goals  Follow Gluten free diet Read food labels Eat small frequent meals Increase fresh fruits and vegetables Read food labels for gluten containing ingredients. Drink 3 boost per day.  MONITORING & EVALUATION Dietary intake, weekly physical activity, and diet in 1 month.  Next Steps  Patient is to work on cleaner eating and focus on plant based foods that are not gluten containing foods.Marland Kitchen

## 2021-09-07 NOTE — Patient Instructions (Addendum)
Goals  Follow Gluten free diet Read food labels Eat small frequent meals Increase fresh fruits and vegetables Read food labels for gluten containing ingredients. Drink 3 boost per day.

## 2021-09-27 ENCOUNTER — Other Ambulatory Visit: Payer: Self-pay | Admitting: Adult Health

## 2021-09-27 ENCOUNTER — Encounter: Payer: Self-pay | Admitting: Nutrition

## 2021-10-19 ENCOUNTER — Encounter: Payer: Self-pay | Admitting: Nutrition

## 2021-10-19 ENCOUNTER — Other Ambulatory Visit: Payer: Self-pay

## 2021-10-19 ENCOUNTER — Encounter: Payer: BC Managed Care – PPO | Attending: Family Medicine | Admitting: Nutrition

## 2021-10-19 DIAGNOSIS — I10 Essential (primary) hypertension: Secondary | ICD-10-CM | POA: Insufficient documentation

## 2021-10-19 DIAGNOSIS — R634 Abnormal weight loss: Secondary | ICD-10-CM | POA: Insufficient documentation

## 2021-10-19 DIAGNOSIS — E441 Mild protein-calorie malnutrition: Secondary | ICD-10-CM | POA: Insufficient documentation

## 2021-10-19 DIAGNOSIS — E611 Iron deficiency: Secondary | ICD-10-CM | POA: Insufficient documentation

## 2021-10-19 DIAGNOSIS — K900 Celiac disease: Secondary | ICD-10-CM | POA: Insufficient documentation

## 2021-10-19 NOTE — Patient Instructions (Signed)
Goals Stop the boost to see if diarrhea improves Eat small frequent meals Goal to get in 1200- 1500 kcal per day Try eating more plant based foods. Gain 1 lb per month

## 2021-10-19 NOTE — Progress Notes (Signed)
Medical Nutrition Therapy Follow up  Appointment Start time:  1500  Appointment End time:  153 Primary concerns today: Celiac disease, weight loss  Referral diagnosis: K90, R63.4 Preferred learning style: no preference.  Learning readiness: .ready    NUTRITION ASSESSMENT  Goes Back to GI 11/06/21   :Progress on Goals previously set: Follow Gluten free diet-has been consistent Read food labels-done Eat small frequent meals-done Increase fresh fruits and vegetables-done Read food labels for gluten containing ingredients-done Drink 3 boost per day.-makes her have diarrhea   No more passing out. Trying to eat more. Has been eating bananas, potatoes, eggs/toast and gluten free foods. Can't eat too much or it hurts her stomach. Can't do fermented foods to help with GI biome due to the acid hurts her stomach and reflux. Drinking 3 boots per day-Gluten free- but they are causing her diarrhea. No longer craving ice since iron infusions. No black stools. No gas, bloating or stomach distentions. Wants to gain weight to about 120 lbs. Wt down slightly to 99.6 lbs... may be due to chronic diarrhea from Boost.  Anthropometrics  Wt Readings from Last 3 Encounters:  09/07/21 102 lb 6.4 oz (46.4 kg)  08/31/21 104 lb 9.6 oz (47.4 kg)  05/08/21 105 lb (47.6 kg)   Ht Readings from Last 3 Encounters:  09/07/21 5\' 5"  (1.651 m)  08/31/21 5\' 5"  (1.651 m)  05/08/21 5\' 5"  (1.651 m)   There is no height or weight on file to calculate BMI. @BMIFA @ Facility age limit for growth percentiles is 20 years. Facility age limit for growth percentiles is 20 years.    Clinical Medical Hx: Celiac disease, HTN, GERD, Anemia Medications: see chart Labs: CBC    Component Value Date/Time   WBC 10.1 08/31/2021 1418   RBC 4.38 08/31/2021 1418   HGB 14.2 08/31/2021 1418   HGB 14.1 04/17/2021 0820   HCT 42.5 08/31/2021 1418   HCT 42.3 04/17/2021 0820   PLT 220 08/31/2021 1418   PLT 208 04/17/2021 0820    MCV 97.0 08/31/2021 1418   MCV 98 (H) 04/17/2021 0820   MCH 32.4 08/31/2021 1418   MCHC 33.4 08/31/2021 1418   RDW 12.8 08/31/2021 1418   RDW 13.6 04/17/2021 0820   LYMPHSABS 2,283 08/31/2021 1418   MONOABS 568 01/09/2017 0934   EOSABS 152 08/31/2021 1418   BASOSABS 61 08/31/2021 1418    Notable Signs/Symptoms: weak, no energy. Tired  Lifestyle & Dietary Hx   Estimated daily fluid intake: 24 oz Supplements: Calcium with Vit D,Estrogen, MVI, Sleep: good Stress / self-care: health issues Current average weekly physical activity: ADL  24-Hr Dietary Recall First Meal:  Boost Lunch; banana sandwich, Sweet tea Dinner: baked potato and chicken, sweet tea  Estimated Energy Needs Calories: 1800-2000 Carbohydrate: 200g Protein: 135g Fat: 50g   NUTRITION DIAGNOSIS  NI-5.4 Decreased nutrient needs (specify): of Gluten As related to Celiac disease.  As evidenced by elevated GI labs. .   Latest Reference Range & Units Most Recent  Deamidated Gliadin Abs, IgG U/mL 47.0 (H) 08/31/21 14:18  Gliadin IgA U/mL 51.9 (H) 08/31/21 14:18  Immunoglobulin A 47 - 310 mg/dL 14/10/2020 14/10/2020 14/1/22  (tTG) Ab, IgA U/mL >250.0 (H) 08/31/21 14:18  (tTG) Ab, IgG U/mL 88.2 (H) 08/31/21 14:18  (H): Data is abnormally high   NUTRITION INTERVENTION  Nutrition education (E-1) on the following topics:  Nutrition and Celiac disease. Effect of Gluten on GI track, lack of absorption, GI symptoms, risk for anemia. Reading food labels,  portion sizes Importance of meal planning and having meals at home more. Importance of elimination diet and strict restrictions.  Handouts Provided Include  Celiac Disease handout Reading food labels My Plant  based meal plan Nutrition lifestyle  Learning Style & Readiness for Change Teaching method utilized: Visual & Auditory  Demonstrated degree of understanding via: Teach Back  Barriers to learning/adherence to lifestyle change: none  Goals Established by  Pt Goals Stop the boost to see if diarrhea improves Eat small frequent meals Goal to get in 1200- 1500 kcal per day Try eating more plant based foods that don't have wheat, soy, rye Gain 1 lb per month   MONITORING & EVALUATION Dietary intake, weekly physical activity, and diet in 6 month.  Next Steps  Patient is to work on cleaner eating and focus on plant based foods that are not gluten containing foods.Marland Kitchen

## 2021-12-06 ENCOUNTER — Other Ambulatory Visit (INDEPENDENT_AMBULATORY_CARE_PROVIDER_SITE_OTHER): Payer: Self-pay | Admitting: Gastroenterology

## 2021-12-07 NOTE — Telephone Encounter (Signed)
This medication should be refilled by her PCP. ?

## 2022-01-01 ENCOUNTER — Other Ambulatory Visit: Payer: Self-pay | Admitting: Adult Health

## 2022-01-04 ENCOUNTER — Ambulatory Visit (INDEPENDENT_AMBULATORY_CARE_PROVIDER_SITE_OTHER): Payer: BC Managed Care – PPO | Admitting: Gastroenterology

## 2022-03-01 ENCOUNTER — Ambulatory Visit (INDEPENDENT_AMBULATORY_CARE_PROVIDER_SITE_OTHER): Payer: BC Managed Care – PPO | Admitting: Gastroenterology

## 2022-03-19 ENCOUNTER — Other Ambulatory Visit (HOSPITAL_COMMUNITY): Payer: Self-pay | Admitting: Adult Health

## 2022-03-19 DIAGNOSIS — R921 Mammographic calcification found on diagnostic imaging of breast: Secondary | ICD-10-CM

## 2022-03-19 DIAGNOSIS — R928 Other abnormal and inconclusive findings on diagnostic imaging of breast: Secondary | ICD-10-CM

## 2022-04-08 ENCOUNTER — Other Ambulatory Visit: Payer: Self-pay | Admitting: Adult Health

## 2022-04-15 ENCOUNTER — Other Ambulatory Visit: Payer: Self-pay | Admitting: Adult Health

## 2022-04-19 ENCOUNTER — Ambulatory Visit (INDEPENDENT_AMBULATORY_CARE_PROVIDER_SITE_OTHER): Payer: BC Managed Care – PPO | Admitting: Adult Health

## 2022-04-19 ENCOUNTER — Encounter: Payer: Self-pay | Admitting: Adult Health

## 2022-04-19 ENCOUNTER — Ambulatory Visit: Payer: BC Managed Care – PPO | Admitting: Nutrition

## 2022-04-19 VITALS — BP 180/69 | HR 60 | Ht 61.5 in | Wt 100.0 lb

## 2022-04-19 DIAGNOSIS — E7800 Pure hypercholesterolemia, unspecified: Secondary | ICD-10-CM

## 2022-04-19 DIAGNOSIS — I10 Essential (primary) hypertension: Secondary | ICD-10-CM | POA: Diagnosis not present

## 2022-04-19 DIAGNOSIS — Z01419 Encounter for gynecological examination (general) (routine) without abnormal findings: Secondary | ICD-10-CM | POA: Diagnosis not present

## 2022-04-19 DIAGNOSIS — Z90710 Acquired absence of both cervix and uterus: Secondary | ICD-10-CM

## 2022-04-19 DIAGNOSIS — K649 Unspecified hemorrhoids: Secondary | ICD-10-CM

## 2022-04-19 DIAGNOSIS — Z79899 Other long term (current) drug therapy: Secondary | ICD-10-CM | POA: Diagnosis not present

## 2022-04-19 DIAGNOSIS — Z1211 Encounter for screening for malignant neoplasm of colon: Secondary | ICD-10-CM

## 2022-04-19 LAB — HEMOCCULT GUIAC POC 1CARD (OFFICE): Fecal Occult Blood, POC: NEGATIVE

## 2022-04-19 MED ORDER — SIMVASTATIN 10 MG PO TABS: ORAL_TABLET | ORAL | 3 refills | Status: DC

## 2022-04-19 NOTE — Progress Notes (Signed)
Patient ID: [name], female   DOB: 1961-02-07, 61 y.o.   MRN: [id number] History of Present Illness: [name] is a 61 year old white female, married, sp hysterectomy in for a well woman gyn exam.  She is on estratest and wants to continue, she is aware of risks and benefits. She wants to gain weight, to about 120 lbs. She keeps grand kids 2 days a week. PCP is Dr Hilma Favors.  Current Medications, Allergies, Past Medical History, Past Surgical History, Family History and Social History were reviewed in [employer] record.     Review of Systems:  Patient denies any headaches, hearing loss, fatigue, blurred vision, shortness of breath, chest pain, abdominal pain, problems with bowel movements, urination, or intercourse. No joint pain or mood swings.    Physical Exam:BP (!) 180/69 (BP Location: Left Arm, Patient Position: Sitting, Cuff Size: Normal)   Pulse 60   Ht 5' 1.5" (1.562 m)   Wt 100 lb (45.4 kg)   BMI 18.59 kg/m   General:  Well developed,thin, no acute distress Skin:  Warm and dry Neck:  Midline trachea, normal thyroid, good ROM, no lymphadenopathy,no carotid bruits heard. Lungs; Clear to auscultation bilaterally Breast:  No dominant palpable mass, retraction, or nipple discharge Cardiovascular: Regular rate and rhythm Abdomen:  Soft, non tender, no hepatosplenomegaly Pelvic:  External genitalia is normal in appearance, no lesions.  The vagina is pale. Urethra has no lesions or masses. The cervix and uterus are absent.  No adnexal masses or tenderness noted.Bladder is non tender, no masses felt. Rectal: Good sphincter tone, no polyps, + hemorrhoids felt.  Hemoccult negative. Extremities/musculoskeletal:  No swelling or varicosities noted, no clubbing or cyanosis Psych:  No mood changes, alert and cooperative,seems happy AA is 0 Fall risk is low    04/19/2022    8:33 AM 04/13/2021    9:33 AM 04/07/2020    8:35 AM  Depression screen PHQ 2/9  Decreased  Interest 0 0 0  Down, Depressed, Hopeless 0 0 0  PHQ - 2 Score 0 0 0  Altered sleeping 0 0 0  Tired, decreased energy 0 0 0  Change in appetite 0 0 0  Feeling bad or failure about yourself  0 0 0  Trouble concentrating 0 0 0  Moving slowly or fidgety/restless 0 0 0  Suicidal thoughts 0 0 0  PHQ-9 Score 0 0 0  Difficult doing work/chores   Not difficult at all       04/19/2022    8:34 AM 04/13/2021    9:33 AM 04/07/2020    8:36 AM  GAD 7 : Generalized Anxiety Score  Nervous, Anxious, on Edge 0 0 0  Control/stop worrying 0 0 0  Worry too much - different things 0 0 0  Trouble relaxing 0 0 0  Restless 0 0 0  Easily annoyed or irritable 0 0 0  Afraid - awful might happen 0 0 0  Total GAD 7 Score 0 0 0  Anxiety Difficulty   Not difficult at all      Upstream - 04/19/22 0840       Pregnancy Intention Screening   Does the patient want to become pregnant in the next year? N/A    Does the patient's partner want to become pregnant in the next year? N/A    Would the patient like to discuss contraceptive options today? N/A      Contraception Wrap Up   Current Method Female Sterilization  hyst   End Method Female Sterilization   hyst   Contraception Counseling Provided No             Examination chaperoned by Levy Pupa LPN  Impression and Plan: 1. Encounter for well woman exam with routine gynecological exam Physical in 1 year will check fasting labs Has mammogram 05/04/22 - CBC - Comprehensive metabolic panel - TSH Colonoscopy  per GI Try to eat very few hour and have milk shake, Boost gave her diarrhea   2. Essential hypertension Continue lisinopril 10 mg has refills  - Comprehensive metabolic panel  3. Current use of estrogen therapy Continue estra test has refills   4. Elevated cholesterol Will refill zocor  Meds ordered this encounter  Medications   simvastatin (ZOCOR) 10 MG tablet    Sig: Take 1 at HS    Dispense:  90 tablet    Refill:  3    Order  Specific Question:   Supervising Provider    Answer:   Tania Ade H [[id number]]    - Lipid panel  5. Encounter for screening fecal occult blood testing Hemoccult was negative  - POCT occult blood stool  6. S/P hysterectomy  7. Hemorrhoids, unspecified hemorrhoid type They are not bothering her

## 2022-04-20 LAB — CBC
Hematocrit: 44.5 % (ref 34.0–46.6)
Hemoglobin: 14.4 g/dL (ref 11.1–15.9)
MCH: 30.8 pg (ref 26.6–33.0)
MCHC: 32.4 g/dL (ref 31.5–35.7)
MCV: 95 fL (ref 79–97)
Platelets: 214 10*3/uL (ref 150–450)
RBC: 4.67 x10E6/uL (ref 3.77–5.28)
RDW: 13.2 % (ref 11.7–15.4)
WBC: 6.9 10*3/uL (ref 3.4–10.8)

## 2022-04-20 LAB — COMPREHENSIVE METABOLIC PANEL
ALT: 26 IU/L (ref 0–32)
AST: 32 IU/L (ref 0–40)
Albumin/Globulin Ratio: 1.5 (ref 1.2–2.2)
Albumin: 4 g/dL (ref 3.9–4.9)
Alkaline Phosphatase: 91 IU/L (ref 44–121)
BUN/Creatinine Ratio: 7 — ABNORMAL LOW (ref 12–28)
BUN: 7 mg/dL — ABNORMAL LOW (ref 8–27)
Bilirubin Total: 0.3 mg/dL (ref 0.0–1.2)
CO2: 27 mmol/L (ref 20–29)
Calcium: 9.1 mg/dL (ref 8.7–10.3)
Chloride: 102 mmol/L (ref 96–106)
Creatinine, Ser: 1.05 mg/dL — ABNORMAL HIGH (ref 0.57–1.00)
Globulin, Total: 2.6 g/dL (ref 1.5–4.5)
Glucose: 80 mg/dL (ref 70–99)
Potassium: 5.2 mmol/L (ref 3.5–5.2)
Sodium: 144 mmol/L (ref 134–144)
Total Protein: 6.6 g/dL (ref 6.0–8.5)
eGFR: 60 mL/min/{1.73_m2} (ref >59)

## 2022-04-20 LAB — LIPID PANEL
Chol/HDL Ratio: 5.5 ratio — ABNORMAL HIGH (ref 0.0–4.4)
Cholesterol, Total: 131 mg/dL (ref 100–199)
HDL: 24 mg/dL — ABNORMAL LOW (ref >39)
LDL Chol Calc (NIH): 88 mg/dL (ref 0–99)
Triglycerides: 101 mg/dL (ref 0–149)
VLDL Cholesterol Cal: 19 mg/dL (ref 5–40)

## 2022-04-20 LAB — TSH: TSH: 1.12 u[IU]/mL (ref 0.450–4.500)

## 2022-05-04 ENCOUNTER — Encounter (HOSPITAL_COMMUNITY): Payer: Self-pay

## 2022-05-04 ENCOUNTER — Ambulatory Visit (HOSPITAL_COMMUNITY)
Admission: RE | Admit: 2022-05-04 | Discharge: 2022-05-04 | Disposition: A | Discharge status: Home / Self Care | Payer: BC Managed Care – PPO | Source: Ambulatory Visit | Attending: Adult Health | Admitting: Adult Health

## 2022-05-04 DIAGNOSIS — R921 Mammographic calcification found on diagnostic imaging of breast: Secondary | ICD-10-CM

## 2022-05-04 DIAGNOSIS — R928 Other abnormal and inconclusive findings on diagnostic imaging of breast: Secondary | ICD-10-CM | POA: Insufficient documentation

## 2022-05-23 ENCOUNTER — Other Ambulatory Visit (INDEPENDENT_AMBULATORY_CARE_PROVIDER_SITE_OTHER): Payer: Self-pay | Admitting: Gastroenterology

## 2022-05-23 DIAGNOSIS — K219 Gastro-esophageal reflux disease without esophagitis: Secondary | ICD-10-CM

## 2022-06-30 ENCOUNTER — Other Ambulatory Visit: Payer: Self-pay | Admitting: Adult Health

## 2022-10-09 ENCOUNTER — Other Ambulatory Visit: Payer: Self-pay | Admitting: Adult Health

## 2022-11-05 ENCOUNTER — Other Ambulatory Visit (INDEPENDENT_AMBULATORY_CARE_PROVIDER_SITE_OTHER): Payer: Self-pay | Admitting: Gastroenterology

## 2022-11-05 DIAGNOSIS — R634 Abnormal weight loss: Secondary | ICD-10-CM

## 2023-02-04 ENCOUNTER — Other Ambulatory Visit (INDEPENDENT_AMBULATORY_CARE_PROVIDER_SITE_OTHER): Payer: Self-pay | Admitting: Gastroenterology

## 2023-02-04 DIAGNOSIS — R634 Abnormal weight loss: Secondary | ICD-10-CM

## 2023-04-08 ENCOUNTER — Other Ambulatory Visit: Payer: Self-pay | Admitting: Adult Health

## 2023-04-22 ENCOUNTER — Other Ambulatory Visit (HOSPITAL_COMMUNITY): Payer: Self-pay | Admitting: Adult Health

## 2023-04-22 DIAGNOSIS — R921 Mammographic calcification found on diagnostic imaging of breast: Secondary | ICD-10-CM

## 2023-05-02 ENCOUNTER — Encounter: Payer: Self-pay | Admitting: Adult Health

## 2023-05-02 ENCOUNTER — Ambulatory Visit (INDEPENDENT_AMBULATORY_CARE_PROVIDER_SITE_OTHER): Payer: BC Managed Care – PPO | Admitting: Adult Health

## 2023-05-02 VITALS — BP 163/84 | HR 56 | Ht 65.0 in | Wt 106.0 lb

## 2023-05-02 DIAGNOSIS — Z79818 Long term (current) use of other agents affecting estrogen receptors and estrogen levels: Secondary | ICD-10-CM

## 2023-05-02 DIAGNOSIS — E7800 Pure hypercholesterolemia, unspecified: Secondary | ICD-10-CM

## 2023-05-02 DIAGNOSIS — Z79899 Other long term (current) drug therapy: Secondary | ICD-10-CM

## 2023-05-02 DIAGNOSIS — I10 Essential (primary) hypertension: Secondary | ICD-10-CM

## 2023-05-02 DIAGNOSIS — Z1211 Encounter for screening for malignant neoplasm of colon: Secondary | ICD-10-CM | POA: Diagnosis not present

## 2023-05-02 DIAGNOSIS — Z01419 Encounter for gynecological examination (general) (routine) without abnormal findings: Secondary | ICD-10-CM | POA: Diagnosis not present

## 2023-05-02 DIAGNOSIS — Z1330 Encounter for screening examination for mental health and behavioral disorders, unspecified: Secondary | ICD-10-CM | POA: Diagnosis not present

## 2023-05-02 DIAGNOSIS — Z90710 Acquired absence of both cervix and uterus: Secondary | ICD-10-CM

## 2023-05-02 LAB — HEMOCCULT GUIAC POC 1CARD (OFFICE): Fecal Occult Blood, POC: NEGATIVE

## 2023-05-02 MED ORDER — LISINOPRIL 10 MG PO TABS: 10.0000 mg | ORAL_TABLET | Freq: Every day | ORAL | 3 refills | Status: DC

## 2023-05-02 MED ORDER — SIMVASTATIN 10 MG PO TABS: ORAL_TABLET | ORAL | 3 refills | Status: DC

## 2023-05-02 NOTE — Progress Notes (Signed)
Patient ID: [name], female   DOB: 10-30-1960, 62 y.o.   MRN: [id number] History of Present Illness: [name] is a 62 year old white female,married, sp hysterectomy in for a well woman gyn exam. 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  PCP is Dr Phillips Odor.   Current Medications, Allergies, Past Medical History, Past Surgical History, Family History and Social History were reviewed in [employer] record.     Review of Systems: Patient denies any  hearing loss, fatigue, blurred vision, shortness of breath, chest pain, abdominal pain, problems with bowel movements, urination, or intercourse. No joint pain or mood swings.  Has had few more headaches lately    Physical Exam:BP (!) 163/84 (BP Location: Left Arm, Patient Position: Sitting, Cuff Size: Normal)   Pulse (!) 56   Ht 5\' 5"  (1.651 m)   Wt 106 lb (48.1 kg)   BMI 17.64 kg/m   General:  Well developed, well nourished, no acute distress Skin:  Warm and dry Neck:  Midline trachea, normal thyroid, good ROM, no lymphadenopathy,no carotid bruits heard  Lungs; Clear to auscultation bilaterally Breast:  No dominant palpable mass, retraction, or nipple discharge Cardiovascular: Regular rate and rhythm Abdomen:  Soft, non tender, no hepatosplenomegaly Pelvic:  External genitalia is normal in appearance, no lesions.  The vagina is pale. Urethra has no lesions or masses. The cervix and uterus are absent.   No adnexal masses or tenderness noted.Bladder is non tender, no masses felt. Rectal: Good sphincter tone, no polyps, or hemorrhoids felt.  Hemoccult negative. Extremities/musculoskeletal:  No swelling or varicosities noted, no clubbing or cyanosis Psych:  No mood changes, alert and cooperative,seems happy AA is 0 Fall risk is low    05/02/2023    9:14 AM 04/19/2022    8:33 AM 04/13/2021    9:33 AM  Depression screen PHQ 2/9  Decreased Interest 0 0 0  Down, Depressed, Hopeless 0 0 0  PHQ - 2 Score 0 0 0   Altered sleeping 0 0 0  Tired, decreased energy 0 0 0  Change in appetite 0 0 0  Feeling bad or failure about yourself  0 0 0  Trouble concentrating 0 0 0  Moving slowly or fidgety/restless 0 0 0  Suicidal thoughts 0 0 0  PHQ-9 Score 0 0 0       05/02/2023    9:15 AM 04/19/2022    8:34 AM 04/13/2021    9:33 AM 04/07/2020    8:36 AM  GAD 7 : Generalized Anxiety Score  Nervous, Anxious, on Edge 0 0 0 0  Control/stop worrying 0 0 0 0  Worry too much - different things 0 0 0 0  Trouble relaxing 0 0 0 0  Restless 0 0 0 0  Easily annoyed or irritable 0 0 0 0  Afraid - awful might happen 0 0 0 0  Total GAD 7 Score 0 0 0 0  Anxiety Difficulty    Not difficult at all    Upstream - 05/02/23 0914       Pregnancy Intention Screening   Does the patient want to become pregnant in the next year? No    Does the patient's partner want to become pregnant in the next year? No    Would the patient like to discuss contraceptive options today? No      Contraception Wrap Up   Current Method Female Sterilization   hysterectomy   End Method Female Sterilization  hysterectomy   Contraception Counseling Provided No              Examination chaperoned by Malachy Mood LPN  Impression and Plan: 1. Encounter for well woman exam with routine gynecological exam Physical in 1 year  Will check labs Mamm[facility] scheduled for 86/24 at [facility]  Colonoscopy per GI   - CBC - Comprehensive metabolic panel - Lipid panel  2. Essential hypertension Keep check on BP Refilled lisinopril 10 mg 1 daily   3. Current use of estrogen therapy She is happy with estratest, has refills   4. S/P hysterectomy  5. Encounter for screening fecal occult blood testing Hemoccult was negative  - POCT occult blood stool  6. Elevated cholesterol Will check labs  Refilled Zocor  Meds ordered this encounter  Medications   lisinopril (ZESTRIL) 10 MG tablet    Sig: Take 1 tablet (10 mg total) by mouth daily.     Dispense:  90 tablet    Refill:  3    Order Specific Question:   Supervising Provider    Answer:   Duane Lope H [[id number]]   simvastatin (ZOCOR) 10 MG tablet    Sig: Take 1 at HS    Dispense:  90 tablet    Refill:  3    Order Specific Question:   Supervising Provider    Answer:   Duane Lope H [[id number]]    - Lipid panel

## 2023-05-07 ENCOUNTER — Ambulatory Visit (HOSPITAL_COMMUNITY)
Admission: RE | Admit: 2023-05-07 | Discharge: 2023-05-07 | Disposition: A | Discharge status: Home / Self Care | Payer: BC Managed Care – PPO | Source: Ambulatory Visit | Attending: Adult Health | Admitting: Adult Health

## 2023-05-07 DIAGNOSIS — R921 Mammographic calcification found on diagnostic imaging of breast: Secondary | ICD-10-CM | POA: Insufficient documentation

## 2023-05-27 ENCOUNTER — Other Ambulatory Visit (INDEPENDENT_AMBULATORY_CARE_PROVIDER_SITE_OTHER): Payer: Self-pay | Admitting: Gastroenterology

## 2023-05-27 DIAGNOSIS — K219 Gastro-esophageal reflux disease without esophagitis: Secondary | ICD-10-CM

## 2023-07-22 ENCOUNTER — Other Ambulatory Visit (INDEPENDENT_AMBULATORY_CARE_PROVIDER_SITE_OTHER): Payer: Self-pay | Admitting: Gastroenterology

## 2023-07-22 DIAGNOSIS — R634 Abnormal weight loss: Secondary | ICD-10-CM

## 2023-07-22 NOTE — Telephone Encounter (Signed)
Needs office visit, last seen Aug 31, 2021

## 2023-10-12 ENCOUNTER — Other Ambulatory Visit: Payer: Self-pay | Admitting: Adult Health

## 2023-12-12 ENCOUNTER — Other Ambulatory Visit: Payer: Self-pay | Admitting: Adult Health

## 2023-12-20 ENCOUNTER — Encounter (INDEPENDENT_AMBULATORY_CARE_PROVIDER_SITE_OTHER): Payer: Self-pay | Admitting: *Deleted

## 2024-01-13 ENCOUNTER — Telehealth (INDEPENDENT_AMBULATORY_CARE_PROVIDER_SITE_OTHER): Payer: Self-pay | Admitting: Gastroenterology

## 2024-01-13 NOTE — Telephone Encounter (Signed)
 Who is your primary care physician: Dr.Fusco  Reasons for the colonoscopy:   Have you had a colonoscopy before?  Yes 2015  Do you have family history of colon cancer? no  Previous colonoscopy with polyps removed? no  Do you have a history colorectal cancer?   no  Are you diabetic? If yes, Type 1 or Type 2?    no  Do you have a prosthetic or mechanical heart valve? no  Do you have a pacemaker/defibrillator?   no  Have you had endocarditis/atrial fibrillation? no  Have you had joint replacement within the last 12 months?  no  Do you tend to be constipated or have to use laxatives?   Do you have any history of drugs or alchohol?    Do you use supplemental oxygen?  no  Have you had a stroke or heart attack within the last 6 months? no  Do you take weight loss medication?    For female patients: have you had a hysterectomy?  yes                                     are you post menopausal?       no                                            do you still have your menstrual cycle? no      Do you take any blood-thinning medications such as: (aspirin, warfarin, Plavix, Aggrenox)  no  If yes we need the name, milligram, dosage and who is prescribing doctor  Current Outpatient Medications on File Prior to Visit  Medication Sig Dispense Refill   acyclovir (ZOVIRAX) 400 MG tablet TAKE 1 TABLET BY MOUTH THREE TIMES A DAY (Patient not taking: Reported on 01/13/2024) 270 tablet 1   alendronate (FOSAMAX) 70 MG tablet Take 1 tablet (70 mg total) by mouth once a week. Take with a full glass of water on an empty stomach. 4 tablet 0   calcium-vitamin D (OSCAL WITH D) 500-200 MG-UNIT tablet Take 1 tablet by mouth daily with breakfast. 30 tablet 0   estrogens-methylTEST (ESTRATEST) 1.25-2.5 MG tablet TAKE 1 TABLET BY MOUTH EVERY DAY 30 tablet 5   lisinopril (ZESTRIL) 10 MG tablet TAKE 1 TABLET BY MOUTH EVERY DAY 90 tablet 3   mirtazapine (REMERON) 7.5 MG tablet Take 1 tablet (7.5 mg total) by  mouth at bedtime. (Patient not taking: Reported on 01/13/2024) 90 tablet 3   omeprazole (PRILOSEC) 40 MG capsule TAKE 1 CAPSULE BY MOUTH EVERY DAY 90 capsule 3   Pediatric Multivitamins-Iron (FLINTSTONES PLUS IRON) chewable tablet Chew 1 tablet by mouth 2 (two) times daily. (Patient not taking: Reported on 01/13/2024)     simvastatin (ZOCOR) 10 MG tablet Take 1 at HS (Patient not taking: Reported on 01/13/2024) 90 tablet 3   No current facility-administered medications on file prior to visit.    Allergies  Allergen Reactions   No Known Allergies      Pharmacy: [facility] [address] [place]   Primary Insurance Name: BCBS  Best number where you can be reached: [phone]  [phone] cell

## 2024-01-13 NOTE — Telephone Encounter (Signed)
 Room 1 Thanks

## 2024-01-21 MED ORDER — PEG 3350-KCL-NA BICARB-NACL 420 G PO SOLR: 4000.0000 mL | Freq: Once | ORAL | 0 refills | Status: AC

## 2024-01-21 NOTE — Telephone Encounter (Signed)
 Pt contacted and scheduled. Instructions will be mailed to pt. Prep sent to pharmacy.

## 2024-01-21 NOTE — Addendum Note (Signed)
 Addended by: Janella Rogala on: 01/21/2024 02:44 PM   Modules accepted: Orders

## 2024-01-22 NOTE — Telephone Encounter (Signed)
 Questionnaire from recall, no referral needed

## 2024-02-25 ENCOUNTER — Encounter (HOSPITAL_COMMUNITY): Payer: Self-pay

## 2024-02-25 ENCOUNTER — Encounter (HOSPITAL_COMMUNITY)
Admission: RE | Admit: 2024-02-25 | Discharge: 2024-02-25 | Disposition: A | Discharge status: Home / Self Care | Source: Ambulatory Visit | Attending: Gastroenterology | Admitting: Gastroenterology

## 2024-02-25 ENCOUNTER — Other Ambulatory Visit: Payer: Self-pay

## 2024-02-28 ENCOUNTER — Ambulatory Visit (HOSPITAL_COMMUNITY): Admitting: Anesthesiology

## 2024-02-28 ENCOUNTER — Encounter (HOSPITAL_COMMUNITY)
Admission: RE | Disposition: A | Discharge status: Home / Self Care | Payer: Self-pay | Source: Home / Self Care | Attending: Gastroenterology

## 2024-02-28 ENCOUNTER — Ambulatory Visit (HOSPITAL_COMMUNITY)
Admission: RE | Admit: 2024-02-28 | Discharge: 2024-02-28 | Disposition: A | Discharge status: Home / Self Care | Attending: Gastroenterology | Admitting: Gastroenterology

## 2024-02-28 ENCOUNTER — Encounter (HOSPITAL_COMMUNITY): Payer: Self-pay | Admitting: Gastroenterology

## 2024-02-28 DIAGNOSIS — K648 Other hemorrhoids: Secondary | ICD-10-CM | POA: Diagnosis not present

## 2024-02-28 DIAGNOSIS — Z1211 Encounter for screening for malignant neoplasm of colon: Secondary | ICD-10-CM | POA: Diagnosis present

## 2024-02-28 DIAGNOSIS — K219 Gastro-esophageal reflux disease without esophagitis: Secondary | ICD-10-CM | POA: Diagnosis not present

## 2024-02-28 DIAGNOSIS — Z87891 Personal history of nicotine dependence: Secondary | ICD-10-CM | POA: Diagnosis not present

## 2024-02-28 DIAGNOSIS — I10 Essential (primary) hypertension: Secondary | ICD-10-CM | POA: Diagnosis not present

## 2024-02-28 DIAGNOSIS — K900 Celiac disease: Secondary | ICD-10-CM | POA: Insufficient documentation

## 2024-02-28 DIAGNOSIS — K6389 Other specified diseases of intestine: Secondary | ICD-10-CM | POA: Insufficient documentation

## 2024-02-28 HISTORY — PX: COLONOSCOPY: SHX5424

## 2024-02-28 LAB — HM COLONOSCOPY

## 2024-02-28 SURGERY — COLONOSCOPY
Anesthesia: General

## 2024-02-28 MED ORDER — LACTATED RINGERS IV SOLN
INTRAVENOUS | Status: DC | PRN
Start: 2024-02-28 — End: 2024-02-28

## 2024-02-28 MED ORDER — LACTATED RINGERS IV SOLN: INTRAVENOUS | Status: DC

## 2024-02-28 MED ORDER — STERILE WATER FOR IRRIGATION IR SOLN
Status: DC | PRN
  Administered 2024-02-28: 120 mL

## 2024-02-28 MED ORDER — PROPOFOL 500 MG/50ML IV EMUL
INTRAVENOUS | Status: DC | PRN
  Administered 2024-02-28: 150 ug/kg/min via INTRAVENOUS

## 2024-02-28 MED ORDER — PROPOFOL 10 MG/ML IV BOLUS
INTRAVENOUS | Status: DC | PRN
  Administered 2024-02-28: 50 mg via INTRAVENOUS

## 2024-02-28 NOTE — Transfer of Care (Signed)
 Immediate Anesthesia Transfer of Care Note  Patient: [name]  Procedure(s) Performed: COLONOSCOPY  Patient Location: Short Stay  Anesthesia Type:General  Level of Consciousness: awake  Airway & Oxygen Therapy: Patient Spontanous Breathing  Post-op Assessment: Report given to RN  Post vital signs: Reviewed and stable  Last Vitals:  Vitals Value Taken Time  BP    Temp    Pulse    Resp    SpO2      Last Pain:  Vitals:   02/28/24 1206  PainSc: 0-No pain         Complications: No notable events documented.

## 2024-02-28 NOTE — H&P (Signed)
 [name] is an 63 y.o. female.   Chief Complaint: CRC screening HPI: 63 yo F with past medical history of GERD, celiac disease, anxiety, hypertension, who presents for CRC screening.  Last colonoscopy in 2015 was normal. The patient denies having any complaints such as melena, hematochezia, abdominal pain or distention, change in her bowel movement consistency or frequency, no changes in weight recently.  No family history of colorectal cancer.   Past Medical History:  Diagnosis Date   Anxiety    Back pain of lumbosacaral region with sciatica 01/03/2016   Celiac disease    Complication of anesthesia    Current use of estrogen therapy 12/02/2014   DJD (degenerative joint disease), lumbosacral 01/11/2016   Dysuria 01/19/2014   Fibrocystic breast changes 01/11/2016   GERD (gastroesophageal reflux disease)    occ   H/O estrogen therapy    Hypertension     Past Surgical History:  Procedure Laterality Date   APPENDECTOMY  2007   had with bso   BACK SURGERY     L4 & L5   BILATERAL SALPINGOOPHORECTOMY  2007   BIOPSY N/A 01/17/2017   Procedure: BIOPSY;  Surgeon: Ruby Corporal, MD;  Location: [facility];  Service: Endoscopy;  Laterality: N/A;   CARPAL TUNNEL RELEASE Right    has had 3x   CESAREAN SECTION     COLONOSCOPY N/A 01/13/2014   Procedure: COLONOSCOPY;  Surgeon: Ruby Corporal, MD;  Location: [facility];  Service: Endoscopy;  Laterality: N/A;  930   ESOPHAGOGASTRODUODENOSCOPY N/A 01/17/2017   Procedure: ESOPHAGOGASTRODUODENOSCOPY (EGD);  Surgeon: Ruby Corporal, MD;  Location: [facility];  Service: Endoscopy;  Laterality: N/A;  2:10   NECK SURGERY  12/2010   TOTAL ABDOMINAL HYSTERECTOMY  07/2005    for menorrhagia and ut. fibroids    Family History  Problem Relation Age of Onset   Parkinsonism Father    Alzheimer's disease Father    Heart attack Brother    Diabetes Sister        borderline   Cancer Sister        kidney   Hypertension Sister    COPD Mother     Hypertension Brother    Colon cancer Neg Hx    Social History:  reports that she quit smoking about 27 years ago. Her smoking use included cigarettes. She started smoking about 32 years ago. She has a 1.3 pack-year smoking history. She has never used smokeless tobacco. She reports that she does not drink alcohol and does not use drugs.  Allergies:  Allergies  Allergen Reactions   No Known Allergies     Medications Prior to Admission  Medication Sig Dispense Refill   alendronate  (FOSAMAX ) 70 MG tablet Take 1 tablet (70 mg total) by mouth once a week. Take with a full glass of water  on an empty stomach. 4 tablet 0   calcium -vitamin D  (OSCAL WITH D) 500-200 MG-UNIT tablet Take 1 tablet by mouth daily with breakfast. 30 tablet 0   estrogens -methylTEST (ESTRATEST) 1.25-2.5 MG tablet TAKE 1 TABLET BY MOUTH EVERY DAY 30 tablet 5   lisinopril  (ZESTRIL ) 10 MG tablet TAKE 1 TABLET BY MOUTH EVERY DAY 90 tablet 3   omeprazole  (PRILOSEC) 40 MG capsule TAKE 1 CAPSULE BY MOUTH EVERY DAY 90 capsule 3   acyclovir  (ZOVIRAX ) 400 MG tablet TAKE 1 TABLET BY MOUTH THREE TIMES A DAY (Patient not taking: Reported on 01/13/2024) 270 tablet 1   mirtazapine  (REMERON ) 7.5 MG tablet  Take 1 tablet (7.5 mg total) by mouth at bedtime. (Patient not taking: Reported on 01/13/2024) 90 tablet 3   Pediatric Multivitamins-Iron  (FLINTSTONES PLUS IRON ) chewable tablet Chew 1 tablet by mouth 2 (two) times daily. (Patient not taking: Reported on 01/13/2024)     simvastatin  (ZOCOR ) 10 MG tablet Take 1 at HS (Patient not taking: Reported on 01/13/2024) 90 tablet 3    No results found for this or any previous visit (from the past 48 hours). No results found.  Review of Systems  All other systems reviewed and are negative.   Blood pressure (!) 145/73, temperature 98.1 F (36.7 C), resp. rate 17, height 5\' 5"  (1.651 m), weight 52.2 kg, SpO2 100%. Physical Exam  GENERAL: The patient is AO x3, in no acute distress. HEENT: Head is  normocephalic and atraumatic. EOMI are intact. Mouth is well hydrated and without lesions. NECK: Supple. No masses LUNGS: Clear to auscultation. No presence of rhonchi/wheezing/rales. Adequate chest expansion HEART: RRR, normal s1 and s2. ABDOMEN: Soft, nontender, no guarding, no peritoneal signs, and nondistended. BS +. No masses. EXTREMITIES: Without any cyanosis, clubbing, rash, lesions or edema. NEUROLOGIC: AOx3, no focal motor deficit. SKIN: no jaundice, no rashes  Assessment/Plan 63 yo F with past medical history of GERD, celiac disease, anxiety, hypertension, who presents for CRC screening.The patient is at average risk for colorectal cancer.  We will proceed with colonoscopy today.  Urban Garden, MD 02/28/2024, 10:49 AM

## 2024-02-28 NOTE — Anesthesia Preprocedure Evaluation (Signed)
 Anesthesia Evaluation  Patient identified by MRN, date of birth, ID band Patient awake    Reviewed: Allergy & Precautions, H&P , NPO status , Patient's Chart, lab work & pertinent test results, reviewed documented beta blocker date and time   History of Anesthesia Complications (+) history of anesthetic complications  Airway Mallampati: II  TM Distance: >3 FB Neck ROM: full    Dental no notable dental hx.    Pulmonary pneumonia, former smoker   Pulmonary exam normal breath sounds clear to auscultation       Cardiovascular Exercise Tolerance: Good hypertension,  Rhythm:regular Rate:Normal     Neuro/Psych   Anxiety      Neuromuscular disease  negative psych ROS   GI/Hepatic Neg liver ROS,GERD  ,,  Endo/Other  negative endocrine ROS    Renal/GU negative Renal ROS  negative genitourinary   Musculoskeletal   Abdominal   Peds  Hematology  (+) Blood dyscrasia, anemia   Anesthesia Other Findings   Reproductive/Obstetrics negative OB ROS                             Anesthesia Physical Anesthesia Plan  ASA: 2  Anesthesia Plan: General   Post-op Pain Management:    Induction:   PONV Risk Score and Plan: Propofol  infusion  Airway Management Planned:   Additional Equipment:   Intra-op Plan:   Post-operative Plan:   Informed Consent: I have reviewed the patients History and Physical, chart, labs and discussed the procedure including the risks, benefits and alternatives for the proposed anesthesia with the patient or authorized representative who has indicated his/her understanding and acceptance.     Dental Advisory Given  Plan Discussed with: CRNA  Anesthesia Plan Comments:        Anesthesia Quick Evaluation

## 2024-02-28 NOTE — Op Note (Signed)
 [facility] Patient Name: [name] Procedure Date: 02/28/2024 11:39 AM MRN: [id number] Date of Birth: [birth year] Attending MD: Samantha Cress , , [id number] CSN: [id number] Age: 63 Admit Type: Outpatient Procedure:                Colonoscopy Indications:              Screening for colorectal malignant neoplasm Providers:                Samantha Cress, Graydon Lazier RN, RN, Theola Fitch Referring MD:              Medicines:                Monitored Anesthesia Care Complications:            No immediate complications. Estimated Blood Loss:     Estimated blood loss: none. Procedure:                Pre-Anesthesia Assessment:                           - Prior to the procedure, a History and Physical                            was performed, and patient medications, allergies                            and sensitivities were reviewed. The patient's                            tolerance of previous anesthesia was reviewed.                           - The risks and benefits of the procedure and the                            sedation options and risks were discussed with the                            patient. All questions were answered and informed                            consent was obtained.                           - ASA Grade Assessment: II - A patient with mild                            systemic disease.                           After obtaining informed consent, the colonoscope                            was passed under direct vision. Throughout the                            procedure, the patient's  blood pressure, pulse, and                            oxygen saturations were monitored continuously. The                            [id number]-[id number] ([id number]) scope was introduced through                            the anus and advanced to the the cecum, identified                            by appendiceal orifice and ileocecal valve. The                             colonoscopy was performed without difficulty. The                            patient tolerated the procedure well. The quality                            of the bowel preparation was adequate. Scope In: 12:11:53 PM Scope Out: 12:34:15 PM Scope Withdrawal Time: 0 hours 16 minutes 14 seconds  Total Procedure Duration: 0 hours 22 minutes 22 seconds  Findings:      The perianal and digital rectal examinations were normal.      A diffuse area of mildly nodular mucosa was found in the ascending colon       and in the cecum. Biopsies were taken with a cold forceps for histology.      Non-bleeding internal hemorrhoids were found during retroflexion. The       hemorrhoids were medium-sized. Impression:               - Nodular mucosa in the ascending colon and in the                            cecum. Biopsied.                           - Non-bleeding internal hemorrhoids. Moderate Sedation:      Per Anesthesia Care Recommendation:           - Discharge patient to home (ambulatory).                           - Resume previous diet.                           - Await pathology results.                           - Repeat colonoscopy for surveillance based on                            pathology results. Procedure Code(s):        --- Professional ---  29562, Colonoscopy, flexible; with biopsy, single                            or multiple Diagnosis Code(s):        --- Professional ---                           Z12.11, Encounter for screening for malignant                            neoplasm of colon                           K63.89, Other specified diseases of intestine                           K64.8, Other hemorrhoids CPT copyright 2022 American Medical Association. All rights reserved. The codes documented in this report are preliminary and upon coder review may  be revised to meet current compliance requirements. Samantha Cress, MD Samantha Cress,  02/28/2024  12:40:01 PM This report has been signed electronically. Number of Addenda: 0

## 2024-02-28 NOTE — Discharge Instructions (Signed)
 You are being discharged to home.  Resume your previous diet.  We are waiting for your pathology results.  Your physician has recommended a repeat colonoscopy for surveillance based on pathology results.

## 2024-03-01 NOTE — Anesthesia Postprocedure Evaluation (Signed)
 Anesthesia Post Note  Patient: [name]  Procedure(s) Performed: COLONOSCOPY  Patient location during evaluation: Phase II Anesthesia Type: General Level of consciousness: awake Pain management: pain level controlled Vital Signs Assessment: post-procedure vital signs reviewed and stable Respiratory status: spontaneous breathing and respiratory function stable Cardiovascular status: blood pressure returned to baseline and stable Postop Assessment: no headache and no apparent nausea or vomiting Anesthetic complications: no Comments: Late entry   No notable events documented.   Last Vitals:  Vitals:   02/28/24 1044 02/28/24 1239  BP: (!) 145/73 (!) 114/49  Pulse:  (!) 58  Resp: 17 18  Temp: 36.7 C (!) 36.4 C  SpO2: 100% 100%    Last Pain:  Vitals:   02/28/24 1239  TempSrc: Axillary  PainSc: 0-No pain                 Coretha Dew

## 2024-03-02 ENCOUNTER — Ambulatory Visit (INDEPENDENT_AMBULATORY_CARE_PROVIDER_SITE_OTHER): Payer: Self-pay | Admitting: Gastroenterology

## 2024-03-02 ENCOUNTER — Encounter (INDEPENDENT_AMBULATORY_CARE_PROVIDER_SITE_OTHER): Payer: Self-pay | Admitting: *Deleted

## 2024-03-02 LAB — SURGICAL PATHOLOGY

## 2024-03-03 ENCOUNTER — Encounter (HOSPITAL_COMMUNITY): Payer: Self-pay | Admitting: Gastroenterology

## 2024-04-18 ENCOUNTER — Other Ambulatory Visit: Payer: Self-pay | Admitting: Adult Health

## 2024-04-20 ENCOUNTER — Other Ambulatory Visit (HOSPITAL_COMMUNITY): Payer: Self-pay | Admitting: Adult Health

## 2024-04-20 DIAGNOSIS — Z1231 Encounter for screening mammogram for malignant neoplasm of breast: Secondary | ICD-10-CM

## 2024-05-11 ENCOUNTER — Ambulatory Visit (HOSPITAL_COMMUNITY)
Admission: RE | Admit: 2024-05-11 | Discharge: 2024-05-11 | Disposition: A | Discharge status: Home / Self Care | Source: Ambulatory Visit | Attending: Adult Health | Admitting: Adult Health

## 2024-05-11 DIAGNOSIS — Z1231 Encounter for screening mammogram for malignant neoplasm of breast: Secondary | ICD-10-CM | POA: Diagnosis present

## 2024-05-12 ENCOUNTER — Ambulatory Visit: Payer: Self-pay | Admitting: Adult Health

## 2024-05-22 ENCOUNTER — Other Ambulatory Visit: Payer: Self-pay | Admitting: Adult Health

## 2024-05-26 ENCOUNTER — Other Ambulatory Visit: Payer: Self-pay | Admitting: Women's Health

## 2024-05-28 ENCOUNTER — Other Ambulatory Visit: Payer: Self-pay | Admitting: Women's Health

## 2024-06-18 ENCOUNTER — Encounter: Payer: Self-pay | Admitting: Adult Health

## 2024-06-18 ENCOUNTER — Ambulatory Visit: Admitting: Adult Health

## 2024-06-18 VITALS — BP 173/79 | HR 42 | Ht 61.5 in | Wt 98.5 lb

## 2024-06-18 DIAGNOSIS — Z90710 Acquired absence of both cervix and uterus: Secondary | ICD-10-CM

## 2024-06-18 DIAGNOSIS — Z01419 Encounter for gynecological examination (general) (routine) without abnormal findings: Secondary | ICD-10-CM | POA: Diagnosis not present

## 2024-06-18 DIAGNOSIS — Z1331 Encounter for screening for depression: Secondary | ICD-10-CM

## 2024-06-18 DIAGNOSIS — E7800 Pure hypercholesterolemia, unspecified: Secondary | ICD-10-CM

## 2024-06-18 DIAGNOSIS — I10 Essential (primary) hypertension: Secondary | ICD-10-CM

## 2024-06-18 NOTE — Progress Notes (Signed)
 Patient ID: [name], female   DOB: 09-30-1961, 63 y.o.   MRN: [id number] History of Present Illness: [name] is a 63 year old white female,married, sp hysterectomy in for a well woman gyn exam. She has been of estratest  for about a month now.  PCP is Charmaine Muzzy FNP   Current Medications, Allergies, Past Medical History, Past Surgical History, Family History and Social History were reviewed in [employer] record.     Review of Systems: Patient denies any  daily headaches(has had one lately but thinks it is sinus related, taking meds), hearing loss, fatigue, blurred vision, shortness of breath, chest pain, abdominal pain, problems with bowel movements, urination, or intercourse. No joint pain or mood swings.     Physical Exam:BP (!) 173/79 (BP Location: Left Arm, Patient Position: Sitting, Cuff Size: Normal)   Pulse (!) 42   Ht 5' 1.5 (1.562 m)   Wt 98 lb 8 oz (44.7 kg)   BMI 18.31 kg/m   General:  Well developed, well nourished, no acute distress Skin:  Warm and dry Neck:  Midline trachea, normal thyroid, good ROM, no lymphadenopathy,no carotid bruits heard Lungs; Clear to auscultation bilaterally Breast:  No dominant palpable mass, retraction, or nipple discharge Cardiovascular: Regular rate and rhythm Abdomen:  Soft, non tender, no hepatosplenomegaly Pelvic:  External genitalia is normal in appearance, no lesions.  The vagina is pale. Urethra has no lesions or masses. The cervix and uterus are absent. No adnexal masses or tenderness noted.Bladder is non tender, no masses felt. Rectal: Deferred  Extremities/musculoskeletal:  No swelling or varicosities noted, no clubbing or cyanosis Psych:  No mood changes, alert and cooperative,seems happy AA is 0 Fall risk is low    06/18/2024   11:27 AM 05/02/2023    9:14 AM 04/19/2022    8:33 AM  Depression screen PHQ 2/9  Decreased Interest 0 0 0  Down, Depressed, Hopeless 0 0 0  PHQ - 2 Score 0 0 0   Altered sleeping 0 0 0  Tired, decreased energy 0 0 0  Change in appetite 0 0 0  Feeling bad or failure about yourself  0 0 0  Trouble concentrating 0 0 0  Moving slowly or fidgety/restless 0 0 0  Suicidal thoughts 0 0 0  PHQ-9 Score 0 0 0       06/18/2024   11:27 AM 05/02/2023    9:15 AM 04/19/2022    8:34 AM 04/13/2021    9:33 AM  GAD 7 : Generalized Anxiety Score  Nervous, Anxious, on Edge 0 0 0 0  Control/stop worrying 0 0 0 0  Worry too much - different things 0 0 0 0  Trouble relaxing 0 0 0 0  Restless 0 0 0 0  Easily annoyed or irritable 0 0 0 0  Afraid - awful might happen 0 0 0 0  Total GAD 7 Score 0 0 0 0      Upstream - 06/18/24 1123       Pregnancy Intention Screening   Does the patient want to become pregnant in the next year? N/A    Does the patient's partner want to become pregnant in the next year? N/A    Would the patient like to discuss contraceptive options today? N/A      Contraception Wrap Up   Current Method Female Sterilization   hyst   End Method Female Sterilization   hyst   Contraception Counseling Provided No  Examination chaperoned by Clarita Salt LPN  Impression and Plan: 1. Encounter for well woman exam with routine gynecological exam (Primary) Physical in 1 year Mammogram was negative 05/11/24 Colonoscopy per GI Labs with PCP  2. S/P hysterectomy  3. Essential hypertension On  meds, follow up with PCP  4. Elevated cholesterol On zocor  10 mg, has refills Had labs this week with PCP

## 2024-07-15 ENCOUNTER — Encounter (INDEPENDENT_AMBULATORY_CARE_PROVIDER_SITE_OTHER): Payer: Self-pay | Admitting: Gastroenterology
